# Patient Record
Sex: Female | Born: 1945 | Race: White | Hispanic: No | Marital: Single | Smoking: Current every day smoker
Health system: Southern US, Community
[De-identification: ages and names within clinical notes are randomized; demographics above are authoritative.]

## PROBLEM LIST (undated history)

## (undated) DIAGNOSIS — F419 Anxiety disorder, unspecified: Secondary | ICD-10-CM

## (undated) DIAGNOSIS — F418 Other specified anxiety disorders: Secondary | ICD-10-CM

## (undated) DIAGNOSIS — F32A Depression, unspecified: Secondary | ICD-10-CM

## (undated) DIAGNOSIS — K5792 Diverticulitis of intestine, part unspecified, without perforation or abscess without bleeding: Secondary | ICD-10-CM

## (undated) DIAGNOSIS — J449 Chronic obstructive pulmonary disease, unspecified: Secondary | ICD-10-CM

## (undated) DIAGNOSIS — K449 Diaphragmatic hernia without obstruction or gangrene: Secondary | ICD-10-CM

## (undated) DIAGNOSIS — E039 Hypothyroidism, unspecified: Secondary | ICD-10-CM

## (undated) DIAGNOSIS — G4700 Insomnia, unspecified: Secondary | ICD-10-CM

## (undated) DIAGNOSIS — G5600 Carpal tunnel syndrome, unspecified upper limb: Secondary | ICD-10-CM

## (undated) DIAGNOSIS — C801 Malignant (primary) neoplasm, unspecified: Secondary | ICD-10-CM

## (undated) DIAGNOSIS — T8859XA Other complications of anesthesia, initial encounter: Secondary | ICD-10-CM

## (undated) DIAGNOSIS — E049 Nontoxic goiter, unspecified: Secondary | ICD-10-CM

## (undated) DIAGNOSIS — T4145XA Adverse effect of unspecified anesthetic, initial encounter: Secondary | ICD-10-CM

## (undated) DIAGNOSIS — K219 Gastro-esophageal reflux disease without esophagitis: Secondary | ICD-10-CM

## (undated) DIAGNOSIS — F329 Major depressive disorder, single episode, unspecified: Secondary | ICD-10-CM

## (undated) DIAGNOSIS — R011 Cardiac murmur, unspecified: Secondary | ICD-10-CM

## (undated) DIAGNOSIS — M1990 Unspecified osteoarthritis, unspecified site: Secondary | ICD-10-CM

## (undated) DIAGNOSIS — Z9289 Personal history of other medical treatment: Secondary | ICD-10-CM

## (undated) HISTORY — DX: Anxiety disorder, unspecified: F41.9

## (undated) HISTORY — PX: HX HYSTERECTOMY: SHX81

## (undated) HISTORY — PX: SPINE SURGERY: SHX786

## (undated) HISTORY — DX: Diverticulitis of intestine, part unspecified, without perforation or abscess without bleeding: K57.92

## (undated) HISTORY — DX: Insomnia, unspecified: G47.00

## (undated) HISTORY — PX: COLONOSCOPY: SHX174

## (undated) HISTORY — PX: SHOULDER SURGERY: SHX246

## (undated) HISTORY — DX: Depression, unspecified: F32.A

## (undated) HISTORY — DX: Diaphragmatic hernia without obstruction or gangrene: K44.9

## (undated) HISTORY — DX: Hypothyroidism, unspecified: E03.9

## (undated) HISTORY — PX: HX THYROID BIOPSY: 2101000001

## (undated) HISTORY — DX: Other specified anxiety disorders: F41.8

## (undated) HISTORY — PX: HX WISDOM TEETH EXTRACTION: SHX21

## (undated) HISTORY — PX: COLONOSCOPY W/ POLYPECTOMY: SHX1380

## (undated) HISTORY — DX: Malignant (primary) neoplasm, unspecified: C80.1

## (undated) HISTORY — DX: Unspecified osteoarthritis, unspecified site: M19.90

## (undated) HISTORY — PX: ABDOMINAL HYSTERECTOMY: SHX81

---

## 1989-03-26 ENCOUNTER — Emergency Department (HOSPITAL_COMMUNITY): Payer: Self-pay

## 2014-02-16 ENCOUNTER — Encounter: Payer: Self-pay | Admitting: Pediatrics

## 2014-03-02 ENCOUNTER — Encounter: Payer: Self-pay | Admitting: Pediatrics

## 2014-04-17 ENCOUNTER — Encounter: Payer: Self-pay | Admitting: Pediatrics

## 2014-04-24 ENCOUNTER — Encounter: Payer: Self-pay | Admitting: Pediatrics

## 2014-07-03 ENCOUNTER — Encounter: Payer: Self-pay | Admitting: Pediatrics

## 2014-07-07 ENCOUNTER — Encounter: Payer: Self-pay | Admitting: Pediatrics

## 2014-08-17 ENCOUNTER — Encounter: Payer: Self-pay | Admitting: Pediatrics

## 2014-11-13 ENCOUNTER — Encounter: Payer: Self-pay | Admitting: Pediatrics

## 2015-06-20 ENCOUNTER — Encounter: Payer: Self-pay | Admitting: Pediatrics

## 2015-07-11 ENCOUNTER — Encounter: Payer: Self-pay | Admitting: Pediatrics

## 2015-08-15 DIAGNOSIS — R87622 Low grade squamous intraepithelial lesion on cytologic smear of vagina (LGSIL): Secondary | ICD-10-CM | POA: Diagnosis not present

## 2015-09-12 DIAGNOSIS — D509 Iron deficiency anemia, unspecified: Secondary | ICD-10-CM | POA: Diagnosis not present

## 2015-09-12 DIAGNOSIS — D696 Thrombocytopenia, unspecified: Secondary | ICD-10-CM | POA: Diagnosis not present

## 2015-09-12 DIAGNOSIS — D72829 Elevated white blood cell count, unspecified: Secondary | ICD-10-CM | POA: Diagnosis not present

## 2015-09-24 DIAGNOSIS — D696 Thrombocytopenia, unspecified: Secondary | ICD-10-CM | POA: Diagnosis not present

## 2015-09-24 DIAGNOSIS — D72829 Elevated white blood cell count, unspecified: Secondary | ICD-10-CM | POA: Diagnosis not present

## 2015-10-04 DIAGNOSIS — D696 Thrombocytopenia, unspecified: Secondary | ICD-10-CM | POA: Diagnosis not present

## 2015-10-04 DIAGNOSIS — D72829 Elevated white blood cell count, unspecified: Secondary | ICD-10-CM | POA: Diagnosis not present

## 2015-10-16 DIAGNOSIS — R630 Anorexia: Secondary | ICD-10-CM | POA: Diagnosis not present

## 2015-10-16 DIAGNOSIS — R110 Nausea: Secondary | ICD-10-CM | POA: Diagnosis not present

## 2015-10-16 DIAGNOSIS — D696 Thrombocytopenia, unspecified: Secondary | ICD-10-CM | POA: Diagnosis not present

## 2015-10-30 DIAGNOSIS — J329 Chronic sinusitis, unspecified: Secondary | ICD-10-CM | POA: Diagnosis not present

## 2015-10-30 DIAGNOSIS — J370 Chronic laryngitis: Secondary | ICD-10-CM | POA: Diagnosis not present

## 2015-10-30 DIAGNOSIS — R51 Headache: Secondary | ICD-10-CM | POA: Diagnosis not present

## 2015-10-30 DIAGNOSIS — J342 Deviated nasal septum: Secondary | ICD-10-CM | POA: Diagnosis not present

## 2015-11-02 DIAGNOSIS — D696 Thrombocytopenia, unspecified: Secondary | ICD-10-CM | POA: Diagnosis not present

## 2015-11-02 DIAGNOSIS — R112 Nausea with vomiting, unspecified: Secondary | ICD-10-CM | POA: Diagnosis not present

## 2015-11-02 DIAGNOSIS — J329 Chronic sinusitis, unspecified: Secondary | ICD-10-CM | POA: Diagnosis not present

## 2015-11-02 DIAGNOSIS — R1013 Epigastric pain: Secondary | ICD-10-CM | POA: Diagnosis not present

## 2015-11-02 DIAGNOSIS — R093 Abnormal sputum: Secondary | ICD-10-CM | POA: Diagnosis not present

## 2015-11-02 DIAGNOSIS — R12 Heartburn: Secondary | ICD-10-CM | POA: Diagnosis not present

## 2015-11-08 DIAGNOSIS — J342 Deviated nasal septum: Secondary | ICD-10-CM | POA: Diagnosis not present

## 2015-11-08 DIAGNOSIS — R0982 Postnasal drip: Secondary | ICD-10-CM | POA: Diagnosis not present

## 2015-11-24 ENCOUNTER — Encounter: Payer: Self-pay | Admitting: Pediatrics

## 2015-11-24 DIAGNOSIS — R002 Palpitations: Secondary | ICD-10-CM | POA: Diagnosis not present

## 2015-11-24 DIAGNOSIS — R079 Chest pain, unspecified: Secondary | ICD-10-CM | POA: Diagnosis not present

## 2015-11-24 DIAGNOSIS — R0789 Other chest pain: Secondary | ICD-10-CM | POA: Diagnosis not present

## 2015-11-26 ENCOUNTER — Encounter: Payer: Self-pay | Admitting: Pediatrics

## 2015-11-26 DIAGNOSIS — K209 Esophagitis, unspecified: Secondary | ICD-10-CM | POA: Diagnosis not present

## 2015-11-26 DIAGNOSIS — K2970 Gastritis, unspecified, without bleeding: Secondary | ICD-10-CM | POA: Diagnosis not present

## 2015-11-26 DIAGNOSIS — K210 Gastro-esophageal reflux disease with esophagitis: Secondary | ICD-10-CM | POA: Diagnosis not present

## 2015-11-30 ENCOUNTER — Encounter: Payer: Self-pay | Admitting: Pediatrics

## 2015-11-30 DIAGNOSIS — G4700 Insomnia, unspecified: Secondary | ICD-10-CM | POA: Diagnosis not present

## 2015-11-30 DIAGNOSIS — Z6823 Body mass index (BMI) 23.0-23.9, adult: Secondary | ICD-10-CM | POA: Diagnosis not present

## 2015-11-30 DIAGNOSIS — D696 Thrombocytopenia, unspecified: Secondary | ICD-10-CM | POA: Diagnosis not present

## 2015-11-30 DIAGNOSIS — E785 Hyperlipidemia, unspecified: Secondary | ICD-10-CM | POA: Diagnosis not present

## 2015-11-30 DIAGNOSIS — K2970 Gastritis, unspecified, without bleeding: Secondary | ICD-10-CM | POA: Diagnosis not present

## 2015-11-30 DIAGNOSIS — F411 Generalized anxiety disorder: Secondary | ICD-10-CM | POA: Diagnosis not present

## 2015-11-30 DIAGNOSIS — I498 Other specified cardiac arrhythmias: Secondary | ICD-10-CM | POA: Diagnosis not present

## 2015-11-30 DIAGNOSIS — I4940 Unspecified premature depolarization: Secondary | ICD-10-CM | POA: Diagnosis not present

## 2015-12-06 DIAGNOSIS — Z86010 Personal history of colonic polyps: Secondary | ICD-10-CM | POA: Diagnosis not present

## 2015-12-06 DIAGNOSIS — K635 Polyp of colon: Secondary | ICD-10-CM | POA: Diagnosis not present

## 2015-12-06 DIAGNOSIS — R634 Abnormal weight loss: Secondary | ICD-10-CM | POA: Diagnosis not present

## 2016-02-19 ENCOUNTER — Encounter: Payer: Self-pay | Admitting: Pediatrics

## 2016-02-19 DIAGNOSIS — Z6824 Body mass index (BMI) 24.0-24.9, adult: Secondary | ICD-10-CM | POA: Diagnosis not present

## 2016-02-19 DIAGNOSIS — F329 Major depressive disorder, single episode, unspecified: Secondary | ICD-10-CM | POA: Diagnosis not present

## 2016-02-19 DIAGNOSIS — K219 Gastro-esophageal reflux disease without esophagitis: Secondary | ICD-10-CM | POA: Diagnosis not present

## 2016-02-19 DIAGNOSIS — E785 Hyperlipidemia, unspecified: Secondary | ICD-10-CM | POA: Diagnosis not present

## 2016-02-19 DIAGNOSIS — E039 Hypothyroidism, unspecified: Secondary | ICD-10-CM | POA: Diagnosis not present

## 2016-02-19 DIAGNOSIS — F331 Major depressive disorder, recurrent, moderate: Secondary | ICD-10-CM | POA: Diagnosis not present

## 2016-02-19 DIAGNOSIS — G4700 Insomnia, unspecified: Secondary | ICD-10-CM | POA: Diagnosis not present

## 2016-02-19 DIAGNOSIS — F17200 Nicotine dependence, unspecified, uncomplicated: Secondary | ICD-10-CM | POA: Diagnosis not present

## 2016-05-09 DIAGNOSIS — L03031 Cellulitis of right toe: Secondary | ICD-10-CM | POA: Diagnosis not present

## 2016-05-09 DIAGNOSIS — L03032 Cellulitis of left toe: Secondary | ICD-10-CM | POA: Diagnosis not present

## 2016-05-23 DIAGNOSIS — M65341 Trigger finger, right ring finger: Secondary | ICD-10-CM | POA: Diagnosis not present

## 2016-05-26 ENCOUNTER — Encounter: Payer: Self-pay | Admitting: Pediatrics

## 2016-05-26 DIAGNOSIS — K219 Gastro-esophageal reflux disease without esophagitis: Secondary | ICD-10-CM | POA: Diagnosis not present

## 2016-05-26 DIAGNOSIS — Z6825 Body mass index (BMI) 25.0-25.9, adult: Secondary | ICD-10-CM | POA: Diagnosis not present

## 2016-05-26 DIAGNOSIS — E785 Hyperlipidemia, unspecified: Secondary | ICD-10-CM | POA: Diagnosis not present

## 2016-05-26 DIAGNOSIS — F17200 Nicotine dependence, unspecified, uncomplicated: Secondary | ICD-10-CM | POA: Diagnosis not present

## 2016-05-26 DIAGNOSIS — E784 Other hyperlipidemia: Secondary | ICD-10-CM | POA: Diagnosis not present

## 2016-05-26 DIAGNOSIS — F331 Major depressive disorder, recurrent, moderate: Secondary | ICD-10-CM | POA: Diagnosis not present

## 2016-05-26 DIAGNOSIS — G4700 Insomnia, unspecified: Secondary | ICD-10-CM | POA: Diagnosis not present

## 2016-06-06 DIAGNOSIS — M1711 Unilateral primary osteoarthritis, right knee: Secondary | ICD-10-CM | POA: Diagnosis not present

## 2016-06-06 DIAGNOSIS — M7989 Other specified soft tissue disorders: Secondary | ICD-10-CM | POA: Diagnosis not present

## 2016-06-06 DIAGNOSIS — M25561 Pain in right knee: Secondary | ICD-10-CM | POA: Diagnosis not present

## 2016-08-27 ENCOUNTER — Encounter: Payer: Self-pay | Admitting: Pediatrics

## 2016-08-27 DIAGNOSIS — K2960 Other gastritis without bleeding: Secondary | ICD-10-CM | POA: Diagnosis not present

## 2016-08-27 DIAGNOSIS — J069 Acute upper respiratory infection, unspecified: Secondary | ICD-10-CM | POA: Diagnosis not present

## 2016-08-27 DIAGNOSIS — K2970 Gastritis, unspecified, without bleeding: Secondary | ICD-10-CM | POA: Diagnosis not present

## 2016-08-27 DIAGNOSIS — R079 Chest pain, unspecified: Secondary | ICD-10-CM | POA: Diagnosis not present

## 2016-08-27 DIAGNOSIS — G4700 Insomnia, unspecified: Secondary | ICD-10-CM | POA: Diagnosis not present

## 2016-08-27 DIAGNOSIS — Z6826 Body mass index (BMI) 26.0-26.9, adult: Secondary | ICD-10-CM | POA: Diagnosis not present

## 2016-08-27 DIAGNOSIS — E785 Hyperlipidemia, unspecified: Secondary | ICD-10-CM | POA: Diagnosis not present

## 2016-08-27 DIAGNOSIS — R0789 Other chest pain: Secondary | ICD-10-CM | POA: Diagnosis not present

## 2016-08-27 DIAGNOSIS — K219 Gastro-esophageal reflux disease without esophagitis: Secondary | ICD-10-CM | POA: Diagnosis not present

## 2016-08-27 DIAGNOSIS — E784 Other hyperlipidemia: Secondary | ICD-10-CM | POA: Diagnosis not present

## 2016-09-10 ENCOUNTER — Encounter: Payer: Self-pay | Admitting: Pediatrics

## 2016-09-10 DIAGNOSIS — E875 Hyperkalemia: Secondary | ICD-10-CM | POA: Diagnosis not present

## 2016-11-21 DIAGNOSIS — K219 Gastro-esophageal reflux disease without esophagitis: Secondary | ICD-10-CM | POA: Diagnosis not present

## 2016-11-21 DIAGNOSIS — G4700 Insomnia, unspecified: Secondary | ICD-10-CM | POA: Diagnosis not present

## 2016-11-21 DIAGNOSIS — Z6825 Body mass index (BMI) 25.0-25.9, adult: Secondary | ICD-10-CM | POA: Diagnosis not present

## 2016-11-21 DIAGNOSIS — F411 Generalized anxiety disorder: Secondary | ICD-10-CM | POA: Diagnosis not present

## 2017-03-05 ENCOUNTER — Encounter: Payer: Self-pay | Admitting: Pediatrics

## 2017-03-05 ENCOUNTER — Encounter (INDEPENDENT_AMBULATORY_CARE_PROVIDER_SITE_OTHER): Payer: Self-pay

## 2017-03-05 ENCOUNTER — Ambulatory Visit (INDEPENDENT_AMBULATORY_CARE_PROVIDER_SITE_OTHER): Payer: MEDICARE | Admitting: Pediatrics

## 2017-03-05 ENCOUNTER — Ambulatory Visit (INDEPENDENT_AMBULATORY_CARE_PROVIDER_SITE_OTHER): Payer: MEDICARE

## 2017-03-05 VITALS — BP 115/79 | HR 64 | Temp 97.7°F | Ht 64.0 in | Wt 138.8 lb

## 2017-03-05 DIAGNOSIS — G8929 Other chronic pain: Secondary | ICD-10-CM

## 2017-03-05 DIAGNOSIS — F419 Anxiety disorder, unspecified: Secondary | ICD-10-CM | POA: Diagnosis not present

## 2017-03-05 DIAGNOSIS — Z1159 Encounter for screening for other viral diseases: Secondary | ICD-10-CM

## 2017-03-05 DIAGNOSIS — F329 Major depressive disorder, single episode, unspecified: Secondary | ICD-10-CM | POA: Diagnosis not present

## 2017-03-05 DIAGNOSIS — M545 Low back pain, unspecified: Secondary | ICD-10-CM

## 2017-03-05 DIAGNOSIS — E049 Nontoxic goiter, unspecified: Secondary | ICD-10-CM

## 2017-03-05 DIAGNOSIS — Z13220 Encounter for screening for lipoid disorders: Secondary | ICD-10-CM | POA: Diagnosis not present

## 2017-03-05 DIAGNOSIS — K219 Gastro-esophageal reflux disease without esophagitis: Secondary | ICD-10-CM

## 2017-03-05 DIAGNOSIS — F32A Depression, unspecified: Secondary | ICD-10-CM

## 2017-03-05 DIAGNOSIS — Z1239 Encounter for other screening for malignant neoplasm of breast: Secondary | ICD-10-CM

## 2017-03-05 DIAGNOSIS — Z1231 Encounter for screening mammogram for malignant neoplasm of breast: Secondary | ICD-10-CM | POA: Diagnosis not present

## 2017-03-05 DIAGNOSIS — Z720 Tobacco use: Secondary | ICD-10-CM

## 2017-03-05 DIAGNOSIS — D649 Anemia, unspecified: Secondary | ICD-10-CM | POA: Diagnosis not present

## 2017-03-05 MED ORDER — MIRTAZAPINE 30 MG PO TABS: 30.0000 mg | ORAL_TABLET | Freq: Every day | ORAL | 1 refills | Status: DC

## 2017-03-05 MED ORDER — CITALOPRAM HYDROBROMIDE 20 MG PO TABS: 20.0000 mg | ORAL_TABLET | Freq: Every day | ORAL | 1 refills | Status: DC

## 2017-03-05 MED ORDER — BUSPIRONE HCL 10 MG PO TABS: 10.0000 mg | ORAL_TABLET | Freq: Two times a day (BID) | ORAL | 1 refills | Status: DC

## 2017-03-05 MED ORDER — OMEPRAZOLE 40 MG PO CPDR: 40.0000 mg | DELAYED_RELEASE_CAPSULE | Freq: Every day | ORAL | 1 refills | Status: DC

## 2017-03-05 NOTE — Progress Notes (Signed)
Subjective:   Patient ID: Carrie Villa, female    DOB: Jan 10, 1946, 71 y.o.   MRN: [id number] CC: New Patient (Initial Visit)  HPI: Carrie Villa is a 71 y.o. female presenting for New Patient (Initial Visit)  Feeling well, better she says since meeting boyfriend, here today in clinic H/o depression Has been on remeron for about 1.5 yrs, started after her divorce Takes all three depression/anxiety meds at night, buspar only once a day  H/o hysterectomy at 71yo, 2 years ago had abnormal pap smear Saw gyn, had something cauterized  Mammo is due Has had fibrocystic breast findings requiring fluid No h/o abnormals  Has a heart murmur Has seen a cardiologist for heart fluttering   When she took cholesterol   Saw a hematologist for what sounds like anemia  GERD: takes PPI every day, has symptoms if she misses doses  Has pain in lower back off and on, used to work bending over a lot  Past Medical History:  Diagnosis Date  . Anxiety   . Arthritis   . Cancer [facility])    Cervix   Family History  Problem Relation Age of Onset  . Cancer Mother   mom had cervical ca Maternal aunt with breast ca  Social History   Social History  . Marital status: Single    Spouse name: N/A  . Number of children: N/A  . Years of education: N/A   Social History Main Topics  . Smoking status: Current Every Day Smoker    Packs/day: 0.50    Types: Cigarettes  . Smokeless tobacco: Never Used  . Alcohol use No  . Drug use: No  . Sexual activity: Yes   Other Topics Concern  . None   Social History Narrative  . None   ROS: All systems negative other than what is in HPI  Objective:    BP 115/79   Pulse 64   Temp 97.7 F (36.5 C) (Oral)   Ht 5' 4" (1.626 m)   Wt 138 lb 12.8 oz (63 kg)   BMI 23.82 kg/m   Wt Readings from Last 3 Encounters:  03/05/17 138 lb 12.8 oz (63 kg)    Gen: NAD, alert, cooperative with exam, NCAT EYES: EOMI, no conjunctival injection, or no icterus ENT:   TMs pearly gray b/l, OP without erythema LYMPH: no cervical LAD NECK: small goiter present CV: NRRR, normal S1/S2, soft II/VI systolc ejection murmur, distal pulses 2+ b/l Resp: CTABL, no wheezes, normal WOB Abd: +BS, soft, NTND. no guarding or organomegaly Ext: No edema, warm Neuro: Alert and oriented, strength equal b/l UE and LE, coordination grossly normal MSK: normal muscle bulk Skin: easily mobile, round, soft apprx 1 cm nodules under the skin x2, upper stomach and L side  Assessment & Plan:  [name] was seen today for new patient (initial visit), follow up med problems  Diagnoses and all orders for this visit:  Depression, unspecified depression type Stable on below, appetite has been returning, cont meds -     citalopram (CELEXA) 20 MG tablet; Take 1 tablet (20 mg total) by mouth daily. -     mirtazapine (REMERON) 30 MG tablet; Take 1 tablet (30 mg total) by mouth daily.  Anxiety Some ongoing daytime anxiety, increase to BID buspar -     busPIRone (BUSPAR) 10 MG tablet; Take 1 tablet (10 mg total) by mouth 2 (two) times daily. -     BMP8+EGFR  Anemia, unspecified type Unclear hx, no records though have  been requested, repeat blood work Last colonoscopy 03/2016 -     CBC with Differential/Platelet  Goiter Off of thyroid medicine, has been on in past -     TSH  Need for hepatitis C screening test -     Hepatitis C antibody  Screening for hyperlipidemia -     Lipid panel  Gastroesophageal reflux disease, esophagitis presence not specified Sx stable on below, cont -     omeprazole (PRILOSEC) 40 MG capsule; Take 1 capsule (40 mg total) by mouth daily.  Screening for breast cancer -     MM Digital Screening; Future  Chronic midline low back pain without sciatica -     DG Lumbar Spine 2-3 Views; Future  Tobacco use Encouraged cessation  Follow up plan: Return in about 6 months (around 09/05/2017). Assunta Found, MD [place]

## 2017-03-06 LAB — CBC WITH DIFFERENTIAL/PLATELET
BASOS: 0 %
Basophils Absolute: 0 10*3/uL (ref 0.0–0.2)
EOS (ABSOLUTE): 0 10*3/uL (ref 0.0–0.4)
Eos: 0 %
HEMATOCRIT: 40.8 % (ref 34.0–46.6)
Hemoglobin: 13.3 g/dL (ref 11.1–15.9)
Immature Grans (Abs): 0 10*3/uL (ref 0.0–0.1)
Immature Granulocytes: 0 %
Lymphocytes Absolute: 2.2 10*3/uL (ref 0.7–3.1)
Lymphs: 27 %
MCH: 30.2 pg (ref 26.6–33.0)
MCHC: 32.6 g/dL (ref 31.5–35.7)
MCV: 93 fL (ref 79–97)
MONOS ABS: 0.8 10*3/uL (ref 0.1–0.9)
Monocytes: 9 %
NEUTROS ABS: 5.2 10*3/uL (ref 1.4–7.0)
Neutrophils: 64 %
Platelets: 112 10*3/uL — ABNORMAL LOW (ref 150–379)
RBC: 4.4 x10E6/uL (ref 3.77–5.28)
RDW: 13.3 % (ref 12.3–15.4)
WBC: 8.2 10*3/uL (ref 3.4–10.8)

## 2017-03-06 LAB — BMP8+EGFR
BUN/Creatinine Ratio: 12 (ref 12–28)
BUN: 8 mg/dL (ref 8–27)
CALCIUM: 9.6 mg/dL (ref 8.7–10.3)
CHLORIDE: 105 mmol/L (ref 96–106)
CO2: 25 mmol/L (ref 20–29)
Creatinine, Ser: 0.67 mg/dL (ref 0.57–1.00)
GFR calc non Af Amer: 89 mL/min/{1.73_m2} (ref >59)
GFR, EST AFRICAN AMERICAN: 103 mL/min/{1.73_m2} (ref >59)
Glucose: 99 mg/dL (ref 65–99)
POTASSIUM: 5.6 mmol/L — AB (ref 3.5–5.2)
Sodium: 143 mmol/L (ref 134–144)

## 2017-03-06 LAB — LIPID PANEL
CHOLESTEROL TOTAL: 193 mg/dL (ref 100–199)
Chol/HDL Ratio: 3.2 ratio (ref 0.0–4.4)
HDL: 60 mg/dL (ref >39)
LDL CALC: 108 mg/dL — AB (ref 0–99)
TRIGLYCERIDES: 123 mg/dL (ref 0–149)
VLDL CHOLESTEROL CAL: 25 mg/dL (ref 5–40)

## 2017-03-06 LAB — HEPATITIS C ANTIBODY: Hep C Virus Ab: <0.1 s/co ratio (ref 0.0–0.9)

## 2017-03-06 LAB — TSH: TSH: 0.951 u[IU]/mL (ref 0.450–4.500)

## 2017-03-09 DIAGNOSIS — H2513 Age-related nuclear cataract, bilateral: Secondary | ICD-10-CM | POA: Diagnosis not present

## 2017-03-09 DIAGNOSIS — H524 Presbyopia: Secondary | ICD-10-CM | POA: Diagnosis not present

## 2017-03-12 ENCOUNTER — Telehealth: Payer: Self-pay | Admitting: Pediatrics

## 2017-03-12 DIAGNOSIS — E785 Hyperlipidemia, unspecified: Secondary | ICD-10-CM

## 2017-03-12 DIAGNOSIS — F329 Major depressive disorder, single episode, unspecified: Secondary | ICD-10-CM

## 2017-03-12 DIAGNOSIS — K219 Gastro-esophageal reflux disease without esophagitis: Secondary | ICD-10-CM

## 2017-03-12 DIAGNOSIS — F32A Depression, unspecified: Secondary | ICD-10-CM

## 2017-03-12 DIAGNOSIS — F419 Anxiety disorder, unspecified: Secondary | ICD-10-CM

## 2017-03-12 MED ORDER — PRAVASTATIN SODIUM 20 MG PO TABS: 20.0000 mg | ORAL_TABLET | Freq: Every day | ORAL | 3 refills | Status: DC

## 2017-03-12 MED ORDER — BUSPIRONE HCL 10 MG PO TABS: 10.0000 mg | ORAL_TABLET | Freq: Two times a day (BID) | ORAL | 1 refills | Status: DC

## 2017-03-12 MED ORDER — MIRTAZAPINE 30 MG PO TABS: 30.0000 mg | ORAL_TABLET | Freq: Every day | ORAL | 1 refills | Status: DC

## 2017-03-12 MED ORDER — CITALOPRAM HYDROBROMIDE 20 MG PO TABS: 20.0000 mg | ORAL_TABLET | Freq: Every day | ORAL | 1 refills | Status: DC

## 2017-03-12 MED ORDER — OMEPRAZOLE 40 MG PO CPDR: 40.0000 mg | DELAYED_RELEASE_CAPSULE | Freq: Every day | ORAL | 1 refills | Status: DC

## 2017-03-12 NOTE — Telephone Encounter (Signed)
ASCVD risk 12.2% over 50yr mostly due to smoking hx Discussed options, pt agreeable to starting statin, sent in.

## 2017-03-12 NOTE — Telephone Encounter (Signed)
Medication sent to [facility] in [facility]

## 2017-03-18 DIAGNOSIS — Z1231 Encounter for screening mammogram for malignant neoplasm of breast: Secondary | ICD-10-CM | POA: Diagnosis not present

## 2017-03-23 ENCOUNTER — Ambulatory Visit (HOSPITAL_COMMUNITY): Payer: Self-pay

## 2017-04-09 ENCOUNTER — Telehealth: Payer: Self-pay | Admitting: Pediatrics

## 2017-04-09 MED ORDER — INDOMETHACIN 25 MG PO CAPS: 25.0000 mg | ORAL_CAPSULE | Freq: Three times a day (TID) | ORAL | 0 refills | Status: DC | PRN

## 2017-04-09 NOTE — Telephone Encounter (Signed)
Pt notified of RX 

## 2017-04-09 NOTE — Telephone Encounter (Signed)
Patient is requesting a prescription for a antiinflammatory to help with her back pain .

## 2017-04-09 NOTE — Telephone Encounter (Signed)
What symptoms do you have? Back pain  How long have you been sick? For a month  Have you been seen for this problem? yes  If your provider decides to give you a prescription, which pharmacy would you like for it to be sent to[facility] [place]   Patient informed that this information will be sent to the clinical staff for review and that they should receive a follow up call.

## 2017-04-22 DIAGNOSIS — R928 Other abnormal and inconclusive findings on diagnostic imaging of breast: Secondary | ICD-10-CM | POA: Diagnosis not present

## 2017-04-22 DIAGNOSIS — R922 Inconclusive mammogram: Secondary | ICD-10-CM | POA: Diagnosis not present

## 2017-05-03 DIAGNOSIS — Z23 Encounter for immunization: Secondary | ICD-10-CM | POA: Diagnosis not present

## 2017-09-05 ENCOUNTER — Other Ambulatory Visit: Payer: Self-pay | Admitting: Pediatrics

## 2017-09-05 DIAGNOSIS — F32A Depression, unspecified: Secondary | ICD-10-CM

## 2017-09-05 DIAGNOSIS — F419 Anxiety disorder, unspecified: Secondary | ICD-10-CM

## 2017-09-05 DIAGNOSIS — K219 Gastro-esophageal reflux disease without esophagitis: Secondary | ICD-10-CM

## 2017-09-05 DIAGNOSIS — F329 Major depressive disorder, single episode, unspecified: Secondary | ICD-10-CM

## 2017-09-07 ENCOUNTER — Ambulatory Visit (INDEPENDENT_AMBULATORY_CARE_PROVIDER_SITE_OTHER): Payer: MEDICARE | Admitting: Pediatrics

## 2017-09-07 ENCOUNTER — Encounter: Payer: Self-pay | Admitting: Pediatrics

## 2017-09-07 VITALS — BP 119/69 | HR 70 | Temp 97.1°F | Ht 64.0 in | Wt 140.4 lb

## 2017-09-07 DIAGNOSIS — F32A Depression, unspecified: Secondary | ICD-10-CM

## 2017-09-07 DIAGNOSIS — F329 Major depressive disorder, single episode, unspecified: Secondary | ICD-10-CM | POA: Diagnosis not present

## 2017-09-07 DIAGNOSIS — M542 Cervicalgia: Secondary | ICD-10-CM

## 2017-09-07 DIAGNOSIS — E785 Hyperlipidemia, unspecified: Secondary | ICD-10-CM | POA: Diagnosis not present

## 2017-09-07 MED ORDER — PREDNISONE 10 MG (21) PO TBPK: ORAL_TABLET | Freq: Every day | ORAL | 0 refills | Status: DC

## 2017-09-07 NOTE — Progress Notes (Signed)
  Subjective:   Patient ID: Carrie Villa, female    DOB: [birth year], 72 y.o.   MRN: [id number] CC: Follow-up (6 month) and Neck Pain (3 month)  HPI: Carrie Villa is a 72 y.o. female presenting for Follow-up (6 month) and Neck Pain (3 month)  Burning sensation from L side of neck into L arm starting about 3 mo ago Had shoulder surgery 8 yrs ago, couldn't raise arm up when surgery done, says they went in and "scraped" the shoulder, ROM improved after that Doesn't remember any injury to her neck at the start of the pain Doesn't have weakness in her L hand or arm  Has tingling/numb feeling that started same time as the neck in L dorsum of foot and lower leg Also new No injury No tripping, no falls Continues to have low back pain off and on  Depression: symptoms controlled on current medicines  HLD: doing fine on cholesterol medicine  Relevant past medical, surgical, family and social history reviewed. Allergies and medications reviewed and updated. Social History   Tobacco Use  Smoking Status Current Every Day Smoker  . Packs/day: 0.50  . Types: Cigarettes  Smokeless Tobacco Never Used   ROS: Per HPI   Objective:    BP 119/69   Pulse 70   Temp (!) 97.1 F (36.2 C) (Oral)   Ht 5\' 4"  (1.626 m)   Wt 140 lb 6.4 oz (63.7 kg)   BMI 24.10 kg/m   Wt Readings from Last 3 Encounters:  09/07/17 140 lb 6.4 oz (63.7 kg)  03/05/17 138 lb 12.8 oz (63 kg)    Gen: NAD, alert, cooperative with exam, NCAT EYES: EOMI, no conjunctival injection, or no icterus ENT: OP without erythema LYMPH: no cervical LAD CV: NRRR, normal S1/S2, no murmur, distal pulses 2+ b/l Resp: CTABL, no wheezes, normal WOB Ext: No edema, warm Neuro: Alert and oriented, coordination grossly normal, decreased sensation over dorsum of L foot compared to R foot MSK: no ttp over cervical spine Normal ROM neck, pain in L side of neck with tilting head to R shoulder ttp top of L shoulder into L side of neck Hand  grip 5/5 b/l  Assessment & Plan:  Carrie Villa was seen today for follow-up and neck pain.  Diagnoses and all orders for this visit:  Neck pain -     Ambulatory referral to Orthopedic Surgery -     predniSONE (STERAPRED UNI-PAK 21 TAB) 10 MG (21) TBPK tablet; Take by mouth daily. As directed x 6 days  Depression, unspecified depression type Stable, cont current meds  Hyperlipidemia, unspecified hyperlipidemia type Stable, cont current meds  Follow up plan: Return in about 6 months (around 03/07/2018). Assunta Found, MD [place]

## 2017-09-08 ENCOUNTER — Telehealth: Payer: Self-pay | Admitting: Pediatrics

## 2017-09-08 ENCOUNTER — Other Ambulatory Visit: Payer: Self-pay | Admitting: *Deleted

## 2017-09-08 NOTE — Telephone Encounter (Signed)
Aware.  Refill request being addressed.

## 2017-09-08 NOTE — Telephone Encounter (Signed)
What is the name of the medication? Antidepressant rx-she is not sure which on it is Pt was here yesterday to see VINCENT  Have you contacted your pharmacy to request a refill? no  [facility]   Patient notified that their request is being sent to the clinical staff for review and that they should receive a call once it is complete. If they do not receive a call within 24 hours they can check with their pharmacy or our office.

## 2017-09-24 ENCOUNTER — Other Ambulatory Visit (INDEPENDENT_AMBULATORY_CARE_PROVIDER_SITE_OTHER): Payer: MEDICARE

## 2017-09-24 ENCOUNTER — Other Ambulatory Visit: Payer: Self-pay | Admitting: Orthopedic Surgery

## 2017-09-24 DIAGNOSIS — M47812 Spondylosis without myelopathy or radiculopathy, cervical region: Secondary | ICD-10-CM | POA: Insufficient documentation

## 2017-09-24 DIAGNOSIS — R52 Pain, unspecified: Secondary | ICD-10-CM | POA: Diagnosis not present

## 2017-09-29 ENCOUNTER — Encounter: Payer: Self-pay | Admitting: Family Medicine

## 2017-09-29 DIAGNOSIS — M47812 Spondylosis without myelopathy or radiculopathy, cervical region: Secondary | ICD-10-CM | POA: Diagnosis not present

## 2017-10-01 DIAGNOSIS — M47812 Spondylosis without myelopathy or radiculopathy, cervical region: Secondary | ICD-10-CM | POA: Diagnosis not present

## 2017-10-15 DIAGNOSIS — M47812 Spondylosis without myelopathy or radiculopathy, cervical region: Secondary | ICD-10-CM | POA: Diagnosis not present

## 2017-12-04 ENCOUNTER — Other Ambulatory Visit: Payer: Self-pay | Admitting: Pediatrics

## 2017-12-04 DIAGNOSIS — F32A Depression, unspecified: Secondary | ICD-10-CM

## 2017-12-04 DIAGNOSIS — F329 Major depressive disorder, single episode, unspecified: Secondary | ICD-10-CM

## 2017-12-04 DIAGNOSIS — F419 Anxiety disorder, unspecified: Secondary | ICD-10-CM

## 2017-12-24 DIAGNOSIS — M47812 Spondylosis without myelopathy or radiculopathy, cervical region: Secondary | ICD-10-CM | POA: Diagnosis not present

## 2018-03-01 ENCOUNTER — Other Ambulatory Visit: Payer: Self-pay | Admitting: Pediatrics

## 2018-03-01 DIAGNOSIS — E785 Hyperlipidemia, unspecified: Secondary | ICD-10-CM

## 2018-03-02 ENCOUNTER — Other Ambulatory Visit: Payer: Self-pay | Admitting: Pediatrics

## 2018-03-02 DIAGNOSIS — F419 Anxiety disorder, unspecified: Secondary | ICD-10-CM

## 2018-03-02 DIAGNOSIS — F32A Depression, unspecified: Secondary | ICD-10-CM

## 2018-03-02 DIAGNOSIS — F329 Major depressive disorder, single episode, unspecified: Secondary | ICD-10-CM

## 2018-03-02 NOTE — Telephone Encounter (Signed)
OV 03/08/18

## 2018-03-03 NOTE — Telephone Encounter (Signed)
OV 03/08/18

## 2018-03-08 ENCOUNTER — Ambulatory Visit (INDEPENDENT_AMBULATORY_CARE_PROVIDER_SITE_OTHER): Payer: MEDICARE | Admitting: Pediatrics

## 2018-03-08 ENCOUNTER — Encounter: Payer: Self-pay | Admitting: Pediatrics

## 2018-03-08 VITALS — BP 127/80 | HR 65 | Temp 96.1°F | Ht 64.0 in | Wt 136.4 lb

## 2018-03-08 DIAGNOSIS — F419 Anxiety disorder, unspecified: Secondary | ICD-10-CM | POA: Diagnosis not present

## 2018-03-08 DIAGNOSIS — R634 Abnormal weight loss: Secondary | ICD-10-CM | POA: Diagnosis not present

## 2018-03-08 DIAGNOSIS — E785 Hyperlipidemia, unspecified: Secondary | ICD-10-CM | POA: Diagnosis not present

## 2018-03-08 DIAGNOSIS — F339 Major depressive disorder, recurrent, unspecified: Secondary | ICD-10-CM

## 2018-03-08 MED ORDER — MIRTAZAPINE 30 MG PO TABS: 30.0000 mg | ORAL_TABLET | Freq: Every day | ORAL | 1 refills | Status: DC

## 2018-03-08 MED ORDER — BUSPIRONE HCL 10 MG PO TABS: 10.0000 mg | ORAL_TABLET | Freq: Two times a day (BID) | ORAL | 1 refills | Status: DC

## 2018-03-08 MED ORDER — CITALOPRAM HYDROBROMIDE 20 MG PO TABS: 20.0000 mg | ORAL_TABLET | Freq: Every day | ORAL | 1 refills | Status: DC

## 2018-03-08 NOTE — Progress Notes (Signed)
  Subjective:   Patient ID: Carrie Villa, female    DOB: 12-05-1945, 72 y.o.   MRN: [id number] CC: Medical Management of Chronic Issues  HPI: Carrie Villa is a 72 y.o. female   Weight loss: She is been trying to gain weight.  Increasing small meals in a day.  She would like to be a size 10, now she is a size 8.  She stays very active during the day.  She does not sit down very much.  Depression: Mood is been doing well.  No change recently in her medicines.  She is been sleeping okay at night.  Anxiety: Symptoms well controlled, taking her medicine regularly.    Hyperlipidemia: Started on pravastatin in the recent past, could not continue it because of pain in her feet.  Relevant past medical, surgical, family and social history reviewed. Allergies and medications reviewed and updated. Social History   Tobacco Use  Smoking Status Current Every Day Smoker  . Packs/day: 0.50  . Types: Cigarettes  Smokeless Tobacco Never Used   ROS: Per HPI   Objective:    BP 127/80   Pulse 65   Temp (!) 96.1 F (35.6 C) (Oral)   Ht _0  (1.626 m)   Wt 136 lb 6.4 oz (61.9 kg)   BMI 23.41 kg/m   Wt Readings from Last 3 Encounters:  03/08/18 136 lb 6.4 oz (61.9 kg)  09/07/17 140 lb 6.4 oz (63.7 kg)  03/05/17 138 lb 12.8 oz (63 kg)    Gen: NAD, alert, cooperative with exam, NCAT EYES: EOMI, no conjunctival injection, or no icterus CV: NRRR, normal S1/S2, no murmur, distal pulses 2+ b/l Resp: CTABL, no wheezes, normal WOB Abd: +BS, soft, mildly tender with deep palpation, ND. no guarding or organomegaly Ext: No edema, warm Neuro: Alert and oriented, strength equal b/l UE and LE, coordination grossly normal MSK: normal muscle bulk  Assessment & Plan:  Carrie Villa was seen today for medical management of chronic issues.  Diagnoses and all orders for this visit:  Depression, recurrent (Carrie Villa)  Stable, continue below-     citalopram (CELEXA) 20 MG tablet; Take 1 tablet (20 mg total) by mouth  daily. -     mirtazapine (REMERON) 30 MG tablet; Take 1 tablet (30 mg total) by mouth daily.  Anxiety Stable, continue below -     busPIRone (BUSPAR) 10 MG tablet; Take 1 tablet (10 mg total) by mouth 2 (two) times daily.  Hyperlipidemia, unspecified hyperlipidemia type Continue regular physical activity, eating lots of fruits and vegetables. -     Lipid panel  Weight loss Slight amount of weight loss.  Will check below. -     CMP14+EGFR -     TSH -     CBC with Differential   Follow up plan: Return in about 3 months (around 06/08/2018). Carrie Found, MD [place]

## 2018-03-09 LAB — CBC WITH DIFFERENTIAL/PLATELET
BASOS ABS: 0 10*3/uL (ref 0.0–0.2)
BASOS: 0 %
EOS (ABSOLUTE): 0.1 10*3/uL (ref 0.0–0.4)
Eos: 1 %
Hematocrit: 43.9 % (ref 34.0–46.6)
Hemoglobin: 14.2 g/dL (ref 11.1–15.9)
IMMATURE GRANS (ABS): 0 10*3/uL (ref 0.0–0.1)
Immature Granulocytes: 0 %
LYMPHS: 26 %
Lymphocytes Absolute: 2.5 10*3/uL (ref 0.7–3.1)
MCH: 30.6 pg (ref 26.6–33.0)
MCHC: 32.3 g/dL (ref 31.5–35.7)
MCV: 95 fL (ref 79–97)
MONOS ABS: 0.7 10*3/uL (ref 0.1–0.9)
Monocytes: 7 %
NEUTROS ABS: 6.4 10*3/uL (ref 1.4–7.0)
NEUTROS PCT: 66 %
PLATELETS: 127 10*3/uL — AB (ref 150–450)
RBC: 4.64 x10E6/uL (ref 3.77–5.28)
RDW: 13.5 % (ref 12.3–15.4)
WBC: 9.7 10*3/uL (ref 3.4–10.8)

## 2018-03-09 LAB — CMP14+EGFR
ALK PHOS: 73 IU/L (ref 39–117)
ALT: 14 IU/L (ref 0–32)
AST: 20 IU/L (ref 0–40)
Albumin/Globulin Ratio: 2.1 (ref 1.2–2.2)
Albumin: 4.4 g/dL (ref 3.5–4.8)
BILIRUBIN TOTAL: 0.3 mg/dL (ref 0.0–1.2)
BUN/Creatinine Ratio: 13 (ref 12–28)
BUN: 9 mg/dL (ref 8–27)
CHLORIDE: 104 mmol/L (ref 96–106)
CO2: 26 mmol/L (ref 20–29)
CREATININE: 0.67 mg/dL (ref 0.57–1.00)
Calcium: 9.5 mg/dL (ref 8.7–10.3)
GFR calc Af Amer: 102 mL/min/{1.73_m2} (ref >59)
GFR calc non Af Amer: 89 mL/min/{1.73_m2} (ref >59)
GLUCOSE: 88 mg/dL (ref 65–99)
Globulin, Total: 2.1 g/dL (ref 1.5–4.5)
Potassium: 4.7 mmol/L (ref 3.5–5.2)
SODIUM: 144 mmol/L (ref 134–144)
Total Protein: 6.5 g/dL (ref 6.0–8.5)

## 2018-03-09 LAB — LIPID PANEL
CHOLESTEROL TOTAL: 222 mg/dL — AB (ref 100–199)
Chol/HDL Ratio: 3.3 ratio (ref 0.0–4.4)
HDL: 67 mg/dL (ref >39)
LDL Calculated: 135 mg/dL — ABNORMAL HIGH (ref 0–99)
Triglycerides: 100 mg/dL (ref 0–149)
VLDL CHOLESTEROL CAL: 20 mg/dL (ref 5–40)

## 2018-03-09 LAB — TSH: TSH: 1.39 u[IU]/mL (ref 0.450–4.500)

## 2018-03-24 ENCOUNTER — Telehealth: Payer: Self-pay | Admitting: Pediatrics

## 2018-03-24 NOTE — Telephone Encounter (Signed)
Reviewed, pt picked up her pravastatin, is going to try taking it every other day

## 2018-03-24 NOTE — Telephone Encounter (Signed)
Will you review labs from last visit. Doesn't appear to be signed off on with a note.

## 2018-04-04 ENCOUNTER — Other Ambulatory Visit: Payer: Self-pay | Admitting: Pediatrics

## 2018-04-04 DIAGNOSIS — E785 Hyperlipidemia, unspecified: Secondary | ICD-10-CM

## 2018-05-21 DIAGNOSIS — Z23 Encounter for immunization: Secondary | ICD-10-CM | POA: Diagnosis not present

## 2018-06-09 ENCOUNTER — Ambulatory Visit (INDEPENDENT_AMBULATORY_CARE_PROVIDER_SITE_OTHER): Payer: MEDICARE | Admitting: Pediatrics

## 2018-06-09 ENCOUNTER — Encounter: Payer: Self-pay | Admitting: Pediatrics

## 2018-06-09 ENCOUNTER — Ambulatory Visit (INDEPENDENT_AMBULATORY_CARE_PROVIDER_SITE_OTHER): Payer: MEDICARE

## 2018-06-09 VITALS — BP 129/81 | HR 79 | Temp 96.9°F | Ht 64.0 in | Wt 147.2 lb

## 2018-06-09 DIAGNOSIS — M25552 Pain in left hip: Secondary | ICD-10-CM | POA: Diagnosis not present

## 2018-06-09 DIAGNOSIS — K219 Gastro-esophageal reflux disease without esophagitis: Secondary | ICD-10-CM

## 2018-06-09 DIAGNOSIS — M7021 Olecranon bursitis, right elbow: Secondary | ICD-10-CM

## 2018-06-09 DIAGNOSIS — F419 Anxiety disorder, unspecified: Secondary | ICD-10-CM

## 2018-06-09 DIAGNOSIS — F339 Major depressive disorder, recurrent, unspecified: Secondary | ICD-10-CM | POA: Diagnosis not present

## 2018-06-09 DIAGNOSIS — M5432 Sciatica, left side: Secondary | ICD-10-CM | POA: Diagnosis not present

## 2018-06-09 DIAGNOSIS — S59901A Unspecified injury of right elbow, initial encounter: Secondary | ICD-10-CM | POA: Diagnosis not present

## 2018-06-09 MED ORDER — PANTOPRAZOLE SODIUM 40 MG PO TBEC: 40.0000 mg | DELAYED_RELEASE_TABLET | Freq: Every day | ORAL | 3 refills | Status: DC

## 2018-06-09 NOTE — Progress Notes (Signed)
  Subjective:   Patient ID: Carrie Villa, female    DOB: 1946/06/05, 72 y.o.   MRN: [id number] CC: Medical Management of Chronic Issues and Elbow Pain  HPI: Carrie Villa is a 72 y.o. female   Depression: Mood is been stable on citalopram and Remeron.  Sleeping well.  Left hip has been bothering her regularly, walking for periods of time, getting up to stand.  Also with pain left lower back.  Regularly has pain going down left leg.  Right elbow has had swelling over the bone that gets larger and smaller over the last few weeks.  No injury that she knows of.  Anxiety: Taking BuSpar twice daily.  Symptoms fairly well controlled.  Relevant past medical, surgical, family and social history reviewed. Allergies and medications reviewed and updated. Social History   Tobacco Use  Smoking Status Current Every Day Smoker  . Packs/day: 0.50  . Types: Cigarettes  Smokeless Tobacco Never Used   ROS: Per HPI   Objective:    BP 129/81   Pulse 79   Temp (!) 96.9 F (36.1 C) (Oral)   Ht 5\' 4"  (1.626 m)   Wt 147 lb 3.2 oz (66.8 kg)   BMI 25.27 kg/m   Wt Readings from Last 3 Encounters:  06/09/18 147 lb 3.2 oz (66.8 kg)  03/08/18 136 lb 6.4 oz (61.9 kg)  09/07/17 140 lb 6.4 oz (63.7 kg)    Gen: NAD, alert, cooperative with exam, NCAT EYES: EOMI, no conjunctival injection, or no icterus ENT: OP without erythema LYMPH: no cervical LAD CV: NRRR, normal S1/S2, no murmur, distal pulses 2+ b/l Resp: CTABL, no wheezes, normal WOB Abd: +BS, soft, NTND. no guarding or organomegaly Ext: No edema, warm Neuro: Alert and oriented MSK: Some pain with external rotation of left hip.  Normal rotation right hip. Small, quarter sized, isolated fluid collection over olecranon, consistent with olecranon bursitis.  Assessment & Plan:  Carrie Villa was seen today for medical management of chronic issues and elbow pain.  Diagnoses and all orders for this visit:  Left hip pain We will check x-ray -     DG  HIP UNILAT W OR W/O PELVIS 2-3 VIEWS LEFT; Future  Olecranon bursitis of right elbow Avoid reinjuring. -     DG Elbow 2 Views Right; Future  Sciatica of left side -     Ambulatory referral to Orthopedic Surgery  Gastroesophageal reflux disease, esophagitis presence not specified Stable, continue below -     pantoprazole (PROTONIX) 40 MG tablet; Take 1 tablet (40 mg total) by mouth daily.  Anxiety Stable, continue current medicines  Recurrent major depressive disorder, remission status unspecified ([place]) Stable, continue current medicines  Follow up plan: Return in about 6 months (around 12/08/2018). Assunta Found, MD [place]

## 2018-07-13 ENCOUNTER — Telehealth: Payer: Self-pay | Admitting: Pediatrics

## 2018-07-13 ENCOUNTER — Ambulatory Visit: Payer: MEDICARE | Admitting: Family Medicine

## 2018-07-13 NOTE — Telephone Encounter (Signed)
Pt advised she would ntbs and scheduled with Dr Dettinger this afternoon at 2:25.

## 2018-07-20 ENCOUNTER — Ambulatory Visit (INDEPENDENT_AMBULATORY_CARE_PROVIDER_SITE_OTHER): Payer: MEDICARE | Admitting: Family Medicine

## 2018-07-20 ENCOUNTER — Encounter: Payer: Self-pay | Admitting: Family Medicine

## 2018-07-20 ENCOUNTER — Encounter: Payer: Self-pay | Admitting: Pediatrics

## 2018-07-20 VITALS — BP 119/69 | HR 66 | Temp 97.0°F | Ht 64.0 in | Wt 149.0 lb

## 2018-07-20 DIAGNOSIS — J069 Acute upper respiratory infection, unspecified: Secondary | ICD-10-CM | POA: Diagnosis not present

## 2018-07-20 DIAGNOSIS — B9789 Other viral agents as the cause of diseases classified elsewhere: Secondary | ICD-10-CM | POA: Diagnosis not present

## 2018-07-20 MED ORDER — CEFDINIR 300 MG PO CAPS: 300.0000 mg | ORAL_CAPSULE | Freq: Two times a day (BID) | ORAL | 0 refills | Status: DC

## 2018-07-20 MED ORDER — GUAIFENESIN-CODEINE 100-10 MG/5ML PO SOLN: 5.0000 mL | Freq: Four times a day (QID) | ORAL | 0 refills | Status: DC | PRN

## 2018-07-20 NOTE — Progress Notes (Signed)
Subjective: CC: Cough PCP: Eustaquio Maize, MD ZOX:WRUEA Rogoff is a 72 y.o. female presenting to clinic today for:  1. Cough Patient reports productive cough that has been ongoing for about 2 weeks.  She notes that sometimes she coughs so hard she has rib pain.  Denies any hemoptysis.  She reports occasional wheeze but no shortness of breath, fevers or chills.  No nausea, vomiting or diarrhea.  No rhinorrhea or nasal congestion.  She has been using Mucinex with no improvement in symptoms.   ROS: Per HPI  Allergies  Allergen Reactions  . Pravastatin     Foot pain at 20mg    Past Medical History:  Diagnosis Date  . Anxiety   . Arthritis   . Cancer [facility])    Cervix    Current Outpatient Medications:  .  busPIRone (BUSPAR) 10 MG tablet, Take 1 tablet (10 mg total) by mouth 2 (two) times daily., Disp: 180 tablet, Rfl: 1 .  citalopram (CELEXA) 20 MG tablet, Take 1 tablet (20 mg total) by mouth daily., Disp: 90 tablet, Rfl: 1 .  mirtazapine (REMERON) 30 MG tablet, Take 1 tablet (30 mg total) by mouth daily., Disp: 90 tablet, Rfl: 1 .  pantoprazole (PROTONIX) 40 MG tablet, Take 1 tablet (40 mg total) by mouth daily., Disp: 30 tablet, Rfl: 3 Social History   Socioeconomic History  . Marital status: Single    Spouse name: Not on file  . Number of children: Not on file  . Years of education: Not on file  . Highest education level: Not on file  Occupational History  . Not on file  Social Needs  . Financial resource strain: Not on file  . Food insecurity:    Worry: Not on file    Inability: Not on file  . Transportation needs:    Medical: Not on file    Non-medical: Not on file  Tobacco Use  . Smoking status: Current Every Day Smoker    Packs/day: 0.50    Types: Cigarettes  . Smokeless tobacco: Never Used  Substance and Sexual Activity  . Alcohol use: No  . Drug use: No  . Sexual activity: Yes  Lifestyle  . Physical activity:    Days per week: Not on file   Minutes per session: Not on file  . Stress: Not on file  Relationships  . Social connections:    Talks on phone: Not on file    Gets together: Not on file    Attends religious service: Not on file    Active member of club or organization: Not on file    Attends meetings of clubs or organizations: Not on file    Relationship status: Not on file  . Intimate partner violence:    Fear of current or ex partner: Not on file    Emotionally abused: Not on file    Physically abused: Not on file    Forced sexual activity: Not on file  Other Topics Concern  . Not on file  Social History Narrative  . Not on file   Family History  Problem Relation Age of Onset  . Cancer Mother     Objective: Office vital signs reviewed. BP 119/69   Pulse 66   Temp (!) 97 F (36.1 C) (Oral)   Ht 5\' 4"  (1.626 m)   Wt 149 lb (67.6 kg)   SpO2 96%   BMI 25.58 kg/m   Physical Examination:  General: Awake, alert, well nourished, No acute distress  HEENT: Normal    Neck: No masses palpated. No lymphadenopathy    Ears: Tympanic membranes intact, normal light reflex, no erythema, no bulging    Eyes: PERRLA, extraocular membranes intact, sclera white    Nose: nasal turbinates moist, no nasal discharge    Throat: moist mucus membranes, no erythema, no tonsillar exudate.  Airway is patent Cardio: regular rate and rhythm, S1S2 heard, no murmurs appreciated Pulm: clear to auscultation bilaterally, no wheezes, rhonchi or rales; normal work of breathing on room air  Assessment/ Plan: 72 y.o. female   1. Viral URI with cough Patient is afebrile nontoxic-appearing with normal vital signs.  She has normal work of breathing and normal oxygen saturation on room air.  Physical exam is unremarkable.  Robitussin-AC prescribed for cough suppressant.  We discussed the sedative nature of the medication and she is not to operate any heavy machinery or drive while taking the medicine.  I have given her a pocket prescription  for Omnicef to use if symptoms not improving given the upcoming holiday.  We reviewed indications for use at length.  Handout provided.  Return cautions discussed.  Follow-up PRN.   No orders of the defined types were placed in this encounter.  Meds ordered this encounter  Medications  . cefdinir (OMNICEF) 300 MG capsule    Sig: Take 1 capsule (300 mg total) by mouth 2 (two) times daily. 1 po BID    Dispense:  20 capsule    Refill:  0  . guaiFENesin-codeine 100-10 MG/5ML syrup    Sig: Take 5 mLs by mouth every 6 (six) hours as needed for cough.    Dispense:  100 mL    Refill:  0   The Narcotic Database has been reviewed.  There were no red flags.      Janora Norlander, DO [place] [phone]

## 2018-07-20 NOTE — Patient Instructions (Signed)
It appears that you have a viral upper respiratory infection (cold).  Cold symptoms can last up to 2 weeks.   I have sent in a cough medication containing codeine.  We discussed this can cause sedation and you should not use it if you need to drive or concentrate. I have also given you a written prescription for an antibiotic that you may use if you develop any other symptoms or signs we discussed or if symptoms are not improving at all in the next 3 days.  - Get plenty of rest and drink plenty of fluids. - Try to breathe moist air. Use a cold mist humidifier. - Consume warm fluids (soup or tea) to provide relief for a stuffy nose and to loosen phlegm. - For nasal stuffiness, try saline nasal spray or a Neti Pot. Afrin nasal spray can also be used but this product should not be used longer than 3 days or it will cause rebound nasal stuffiness (worsening nasal congestion). - For sore throat pain relief: use chloraseptic spray, suck on throat lozenges, hard candy or popsicles; gargle with warm salt water (1/4 tsp. salt per 8 oz. of water); and eat soft, bland foods. - Eat a well-balanced diet. If you cannot, ensure you are getting enough nutrients by taking a daily multivitamin. - Avoid dairy products, as they can thicken phlegm. - Avoid alcohol, as it impairs your body's immune system.  CONTACT YOUR DOCTOR IF YOU EXPERIENCE ANY OF THE FOLLOWING: - High fever - Ear pain - Sinus-type headache - Unusually severe cold symptoms - Cough that gets worse while other cold symptoms improve - Flare up of any chronic lung problem, such as asthma - Your symptoms persist longer than 2 weeks

## 2018-08-10 ENCOUNTER — Telehealth: Payer: Self-pay | Admitting: Pediatrics

## 2018-08-10 NOTE — Telephone Encounter (Signed)
Patient aware referral has been placed to [facility]

## 2018-08-17 ENCOUNTER — Encounter: Payer: Self-pay | Admitting: Pediatrics

## 2018-08-17 ENCOUNTER — Encounter (INDEPENDENT_AMBULATORY_CARE_PROVIDER_SITE_OTHER): Payer: MEDICARE | Admitting: Pediatrics

## 2018-08-17 ENCOUNTER — Other Ambulatory Visit: Payer: Self-pay | Admitting: *Deleted

## 2018-08-17 DIAGNOSIS — G8929 Other chronic pain: Secondary | ICD-10-CM

## 2018-08-17 DIAGNOSIS — M5432 Sciatica, left side: Secondary | ICD-10-CM

## 2018-08-17 DIAGNOSIS — M25552 Pain in left hip: Secondary | ICD-10-CM

## 2018-08-17 DIAGNOSIS — M545 Low back pain: Secondary | ICD-10-CM

## 2018-08-17 NOTE — Progress Notes (Signed)
Record request sent to [facility]

## 2018-08-26 ENCOUNTER — Ambulatory Visit (INDEPENDENT_AMBULATORY_CARE_PROVIDER_SITE_OTHER): Payer: MEDICARE | Admitting: Orthopaedic Surgery

## 2018-08-26 ENCOUNTER — Encounter (INDEPENDENT_AMBULATORY_CARE_PROVIDER_SITE_OTHER): Payer: Self-pay | Admitting: Orthopaedic Surgery

## 2018-08-26 VITALS — BP 139/85 | HR 65 | Ht 64.0 in | Wt 149.0 lb

## 2018-08-26 DIAGNOSIS — M549 Dorsalgia, unspecified: Secondary | ICD-10-CM

## 2018-08-26 DIAGNOSIS — M542 Cervicalgia: Secondary | ICD-10-CM

## 2018-08-26 DIAGNOSIS — M419 Scoliosis, unspecified: Secondary | ICD-10-CM | POA: Diagnosis not present

## 2018-08-26 DIAGNOSIS — Z720 Tobacco use: Secondary | ICD-10-CM | POA: Diagnosis not present

## 2018-08-26 NOTE — Progress Notes (Signed)
Office Visit Note   Patient: Carrie Villa           Date of Birth: 05/24/1946           MRN: [id number] Visit Date: 08/26/2018              Requested by: Eustaquio Maize, MD [place], [place] [id number] PCP: Eustaquio Maize, MD   Assessment & Plan: Visit Diagnoses:  1. Neck pain     Plan: Patient has chronic neck and back pain or symptoms left arm.  She does have significant scoliosis and plain radiograph showed C6-7 spondylosis.  We will obtain an  Cervical MRI scan and see her back after the scan of her cervical spine.  Follow-Up Instructions: No follow-ups on file.   Orders:  Orders Placed This Encounter  Procedures  . MR Cervical Spine w/o contrast   No orders of the defined types were placed in this encounter.     Procedures: No procedures performed   Clinical Data: No additional findings.   Subjective: Chief Complaint  Patient presents with  . Neck - Pain  . Lower Back - Pain    HPI 73 year old female with greater than 5 years chronic neck pain and buttocks pain.  She states recently pains and tingling is gone down to her hand and left finger.  Patient states she also has pain that radiates down her left leg sharp to her toes the back of her heels with some numbness.  Patient is been through therapy in the past years but did not get any relief.  She is used icy hot she states the pain is 8 out of 10 makes her nauseous.  She has a 1 pack/day smoker.  She has had previous x-rays last year cervical spine and the year before lumbar spine.  Patient has significant rightward scoliosis of the lumbar spine with degenerative changes.  C-spine shows disc space narrowing particularly at C6-7 level with collapse of disc space and spurring.  No associated bowel or bladder symptoms.  Review of Systems positive for GERD 1 pack/day smoking long-term.  Positive for depression on Celexa.  Neck and back pain otherwise -14 point systems as it pertains to  HPI.   Objective: Vital Signs: BP 139/85   Pulse 65   Ht 5\' 4"  (1.626 m)   Wt 149 lb (67.6 kg)   BMI 25.58 kg/m   Physical Exam Constitutional:      Appearance: She is well-developed.  HENT:     Head: Normocephalic.     Right Ear: External ear normal.     Left Ear: External ear normal.  Eyes:     Pupils: Pupils are equal, round, and reactive to light.  Neck:     Thyroid: No thyromegaly.     Trachea: No tracheal deviation.  Cardiovascular:     Rate and Rhythm: Normal rate.  Pulmonary:     Effort: Pulmonary effort is normal.  Abdominal:     Palpations: Abdomen is soft.  Skin:    General: Skin is warm and dry.  Neurological:     Mental Status: She is alert and oriented to person, place, and time.  Psychiatric:        Behavior: Behavior normal.     Ortho Exam patient is able to heel and toe walk with heel walking she has left leg pain.  Positive straight leg raising on the left at 80 degrees she has sciatic notch tenderness some tenderness of  the paralumbar muscles minimal trochanteric bursal tenderness quad anterior tib weakness.  With brachial plexus tenderness she has index finger tremor.  She complains of numbness over the small finger negative Tinel's over the cubital tunnel on the left and right.  No thenar or hyperthenar atrophy biceps triceps is strong.  She has brisk symmetrical reflexes with to be lower extremity clonus.  Wrist extension weakness finger extension weakness and no interosseous weakness or grip weakness right or left. Specialty Comments:  No specialty comments available.  Imaging: No results found.   PMFS History: Patient Active Problem List   Diagnosis Date Noted  . 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 Anxiety   . Arthritis   . Cancer [facility])    Cervix    Family History  Problem Relation Age of Onset  . Cancer Mother     Past Surgical History:  Procedure Laterality Date  . ABDOMINAL HYSTERECTOMY      Social History   Occupational History  . Not on file  Tobacco Use  . Smoking status: Current Every Day Smoker    Packs/day: 0.50    Types: Cigarettes  . Smokeless tobacco: Never Used  Substance and Sexual Activity  . Alcohol use: No  . Drug use: No  . Sexual activity: Yes

## 2018-08-31 DIAGNOSIS — M4802 Spinal stenosis, cervical region: Secondary | ICD-10-CM | POA: Diagnosis not present

## 2018-08-31 DIAGNOSIS — M4722 Other spondylosis with radiculopathy, cervical region: Secondary | ICD-10-CM | POA: Diagnosis not present

## 2018-08-31 DIAGNOSIS — M542 Cervicalgia: Secondary | ICD-10-CM | POA: Diagnosis not present

## 2018-08-31 DIAGNOSIS — Z9181 History of falling: Secondary | ICD-10-CM | POA: Diagnosis not present

## 2018-09-02 ENCOUNTER — Encounter (INDEPENDENT_AMBULATORY_CARE_PROVIDER_SITE_OTHER): Payer: Self-pay | Admitting: Orthopaedic Surgery

## 2018-09-02 ENCOUNTER — Ambulatory Visit (INDEPENDENT_AMBULATORY_CARE_PROVIDER_SITE_OTHER): Payer: MEDICARE | Admitting: Orthopaedic Surgery

## 2018-09-02 VITALS — BP 133/83 | HR 63 | Ht 64.0 in | Wt 149.0 lb

## 2018-09-02 DIAGNOSIS — M4722 Other spondylosis with radiculopathy, cervical region: Secondary | ICD-10-CM | POA: Diagnosis not present

## 2018-09-02 DIAGNOSIS — M4802 Spinal stenosis, cervical region: Secondary | ICD-10-CM

## 2018-09-02 DIAGNOSIS — M542 Cervicalgia: Secondary | ICD-10-CM

## 2018-09-02 NOTE — Progress Notes (Signed)
Office Visit Note   Patient: Carrie Villa           Date of Birth: [birth year]           MRN: [id number] Visit Date: 09/02/2018              Requested by: Eustaquio Maize, [place], [place] [address] PCP: Claretta Fraise, MD   Assessment & Plan: Visit Diagnoses:  1. Neck pain   2. Other spondylosis with radiculopathy, cervical region   3. Foraminal stenosis of cervical region     Plan: She has multilevel involvement.  Her pain radiates down to the ulnar side of her left hand and she has severe foraminal stenosis multilevels on the left side.  I would recommend proceeding with EMGs nerve conduction velocities to see if she also has cubital tunnel syndrome.  Office follow-up after test for review.  Follow-Up Instructions: No follow-ups on file.   Orders:  Orders Placed This Encounter  Procedures  . Ambulatory referral to Physical Medicine Rehab   No orders of the defined types were placed in this encounter.     Procedures: No procedures performed   Clinical Data: No additional findings.   Subjective: Chief Complaint  Patient presents with  . Neck - Follow-up    MRI Cervical Spine Review    HPI 73 year old female returns with ongoing chronic neck pain and left arm pain.  Plain radiograph showed C6-7 spondylosis and MRI scan is been obtained and is available for review.  Pain is been present for greater than 5 years.  States that time her pain is 8 out of 10.  No fever chills no bowel or bladder symptoms no myelopathic symptoms.  Past therapy treatment, anti-inflammatories have not been effective.  Review of Systems 14 point review of systems unchanged from 08/26/2018 other than as mentioned in HPI.   Objective: Vital Signs: BP 133/83   Pulse 63   Ht 5\' 4"  (1.626 m)   Wt 149 lb (67.6 kg)   BMI 25.58 kg/m   Physical Exam Constitutional:      Appearance: She is well-developed.  HENT:     Head: Normocephalic.     Right Ear: External ear normal.     Left Ear: External ear normal.  Eyes:     Pupils: Pupils are equal, round, and reactive to light.  Neck:     Thyroid: No thyromegaly.     Trachea: No tracheal deviation.  Cardiovascular:     Rate and Rhythm: Normal rate.  Pulmonary:     Effort: Pulmonary effort is normal.  Abdominal:     Palpations: Abdomen is soft.  Skin:    General: Skin is warm and dry.  Neurological:     Mental Status: She is alert and oriented to person, place, and time.  Psychiatric:        Behavior: Behavior normal.     Ortho Exam lower extremity hyperreflexia.  She has pain with palpation of brachial plexus on the left side minimal on the right.  With palpation of the brachial plexus she still has a wrist and finger tremor.  Positive Tinel's over both cubital tunnel.  No interossei weakness.  Specialty Comments:  No specialty comments available.  Imaging: Cervical MRI scan 08/31/2018 shows asymmetric multilevel severe left facet arthropathy superimposed acute inflammatory changes left C3-4 facet and associated paraspinal muscle strain.  No fracture or malalignment.  Mild canal stenosis C6-7.  Neuroforaminal narrowing C3-4 through C6-7 severe on  the left at C4-5 and see 6 7.   PMFS History: Patient Active Problem List   Diagnosis Date Noted  . 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 Anxiety   . Arthritis   . Cancer [facility])    Cervix    Family History  Problem Relation Age of Onset  . Cancer Mother     Past Surgical History:  Procedure Laterality Date  . ABDOMINAL HYSTERECTOMY     Social History   Occupational History  . Not on file  Tobacco Use  . Smoking status: Current Every Day Smoker    Packs/day: 0.50    Types: Cigarettes  . Smokeless tobacco: Never Used  Substance and Sexual Activity  . Alcohol use: No  . Drug use: No  . Sexual activity: Yes

## 2018-09-05 ENCOUNTER — Other Ambulatory Visit: Payer: Self-pay | Admitting: Pediatrics

## 2018-09-05 ENCOUNTER — Encounter (INDEPENDENT_AMBULATORY_CARE_PROVIDER_SITE_OTHER): Payer: Self-pay | Admitting: Orthopaedic Surgery

## 2018-09-05 DIAGNOSIS — F419 Anxiety disorder, unspecified: Secondary | ICD-10-CM

## 2018-09-05 DIAGNOSIS — F339 Major depressive disorder, recurrent, unspecified: Secondary | ICD-10-CM

## 2018-09-17 ENCOUNTER — Ambulatory Visit (INDEPENDENT_AMBULATORY_CARE_PROVIDER_SITE_OTHER): Payer: MEDICARE | Admitting: Physical Medicine and Rehabilitation

## 2018-09-17 ENCOUNTER — Encounter (INDEPENDENT_AMBULATORY_CARE_PROVIDER_SITE_OTHER): Payer: Self-pay | Admitting: Physical Medicine and Rehabilitation

## 2018-09-17 DIAGNOSIS — R202 Paresthesia of skin: Secondary | ICD-10-CM | POA: Diagnosis not present

## 2018-09-17 NOTE — Progress Notes (Signed)
.  Numeric Pain Rating Scale and Functional Assessment Average Pain 0   In the last MONTH (on 0-10 scale) has pain interfered with the following?  1. General activity like being  able to carry out your everyday physical activities such as walking, climbing stairs, carrying groceries, or moving a chair?  Rating(6)    

## 2018-09-21 NOTE — Procedures (Signed)
EMG & NCV Findings: Evaluation of the left median (across palm) sensory nerve showed prolonged distal peak latency (Wrist, 3.8 ms) and prolonged distal peak latency (Palm, 2.2 ms).  All remaining nerves (as indicated in the following tables) were within normal limits.    Needle evaluation of the left pronator teres and the left Ext Digitorum muscles showed increased insertional activity.  All remaining muscles (as indicated in the following table) showed no evidence of electrical instability.    Impression: The above electrodiagnostic study is ABNORMAL and reveals evidence of a mild left median nerve entrapment at the wrist (carpal tunnel syndrome) affecting sensory components.  There is also evidence suggestive but not fully diagnostic of very mild chronic C7 radiculopathy on the left.    There is no significant electrodiagnostic evidence of any other focal nerve entrapment or brachial plexopathy.   Recommendations: 1.  Follow-up with referring physician. 2.  Continue current management of symptoms.  ___________________________ Laurence Spates FAAPMR Board Certified, American Board of Physical Medicine and Rehabilitation    Nerve Conduction Studies Anti Sensory Summary Table   Stim Site NR Peak (ms) Norm Peak (ms) P-T Amp (V) Norm P-T Amp Site1 Site2 Delta-P (ms) Dist (cm) Vel (m/s) Norm Vel (m/s)  Left Median Acr Palm Anti Sensory (2nd Digit)  30.8C  Wrist    *3.8 <3.6 18.9 >10 Wrist Palm 1.6 0.0    Palm    *2.2 <2.0 16.0         Left Radial Anti Sensory (Base 1st Digit)  30.7C  Wrist    2.4 <3.1 20.9  Wrist Base 1st Digit 2.4 0.0    Left Ulnar Anti Sensory (5th Digit)  30.9C  Wrist    3.6 <3.7 22.4 >15.0 Wrist 5th Digit 3.6 14.0 39 >38   Motor Summary Table   Stim Site NR Onset (ms) Norm Onset (ms) O-P Amp (mV) Norm O-P Amp Site1 Site2 Delta-0 (ms) Dist (cm) Vel (m/s) Norm Vel (m/s)  Left Median Motor (Abd Poll Brev)  31C  Wrist    3.7 <4.2 5.5 >5 Elbow Wrist 4.3 21.5 50 >50    Elbow    8.0  5.3         Left Ulnar Motor (Abd Dig Min)  31.1C  Wrist    3.4 <4.2 6.9 >3 B Elbow Wrist 3.5 19.5 56 >53  B Elbow    6.9  7.3  A Elbow B Elbow 1.5 10.0 67 >53  A Elbow    8.4  7.4          EMG   Side Muscle Nerve Root Ins Act Fibs Psw Amp Dur Poly Recrt Int Fraser Din Comment  Left Abd Poll Brev Median C8-T1 Nml Nml Nml Nml Nml 0 Nml Nml   Left 1stDorInt Ulnar C8-T1 Nml Nml Nml Nml Nml 0 Nml Nml   Left PronatorTeres Median C6-7 *Incr Nml Nml Nml Nml 0 Nml Nml   Left Biceps Musculocut C5-6 Nml Nml Nml Nml Nml 0 Nml Nml   Left Deltoid Axillary C5-6 Nml Nml Nml Nml Nml 0 Nml Nml   Left Ext Digitorum  Radial (Post Int) C7-8 *Incr Nml Nml Nml Nml 0 Nml Nml     Nerve Conduction Studies Anti Sensory Left/Right Comparison   Stim Site L Lat (ms) R Lat (ms) L-R Lat (ms) L Amp (V) R Amp (V) L-R Amp (%) Site1 Site2 L Vel (m/s) R Vel (m/s) L-R Vel (m/s)  Median Acr Palm Anti Sensory (2nd Digit)  30.[place]  Wrist *3.8   18.9   Wrist Palm     Palm *2.2   16.0         Radial Anti Sensory (Base 1st Digit)  30.7C  Wrist 2.4   20.9   Wrist Base 1st Digit     Ulnar Anti Sensory (5th Digit)  30.9C  Wrist 3.6   22.4   Wrist 5th Digit 39     Motor Left/Right Comparison   Stim Site L Lat (ms) R Lat (ms) L-R Lat (ms) L Amp (mV) R Amp (mV) L-R Amp (%) Site1 Site2 L Vel (m/s) R Vel (m/s) L-R Vel (m/s)  Median Motor (Abd Poll Brev)  31C  Wrist 3.7   5.5   Elbow Wrist 50    Elbow 8.0   5.3         Ulnar Motor (Abd Dig Min)  31.1C  Wrist 3.4   6.9   B Elbow Wrist 56    B Elbow 6.9   7.3   A Elbow B Elbow 67    A Elbow 8.4   7.4            Waveforms:

## 2018-09-21 NOTE — Progress Notes (Signed)
Carrie Villa - 73 y.o. female MRN [id number]  Date of birth: Dec 28, 1945  Office Visit Note: Visit Date: 09/17/2018 PCP: Claretta Fraise, MD Referred by: Claretta Fraise, MD  Subjective: Chief Complaint  Patient presents with  . Neck - Numbness, Tingling  . Left Shoulder - Numbness, Tingling  . Left Arm - Numbness, Tingling  . Left Hand - Numbness, Tingling   HPI: Carrie Villa is a 73 y.o. female who comes in today At the request of Dr. Rodell Perna for electrodiagnostic study of the left upper arm.  Patient reports 1 year of chronic worsening neck pain with referral in the left shoulder and left arm and left hand particularly in the fifth digit but also sometimes the thumb.  She does get a lot of numbness in the hands with some nocturnal complaints.  She is right-hand dominant.  She has had no prior electrodiagnostic study.  She denies any symptoms in the right hand.  She reports worsening which can just be intermittent and she reports symptoms are fairly constant.  ROS Otherwise per HPI.  Assessment & Plan: Visit Diagnoses:  1. Paresthesia of skin     Plan: Impression: The above electrodiagnostic study is ABNORMAL and reveals evidence of a mild left median nerve entrapment at the wrist (carpal tunnel syndrome) affecting sensory components.  There is also evidence suggestive but not fully diagnostic of very mild chronic C7 radiculopathy on the left.    There is no significant electrodiagnostic evidence of any other focal nerve entrapment or brachial plexopathy.   Recommendations: 1.  Follow-up with referring physician. 2.  Continue current management of symptoms.   Meds & Orders: No orders of the defined types were placed in this encounter.   Orders Placed This Encounter  Procedures  . NCV with EMG (electromyography)    Follow-up: Return for Rodell Perna, M.D..   Procedures: No procedures performed  EMG & NCV Findings: Evaluation of the left median (across palm) sensory  nerve showed prolonged distal peak latency (Wrist, 3.8 ms) and prolonged distal peak latency (Palm, 2.2 ms).  All remaining nerves (as indicated in the following tables) were within normal limits.    Needle evaluation of the left pronator teres and the left Ext Digitorum muscles showed increased insertional activity.  All remaining muscles (as indicated in the following table) showed no evidence of electrical instability.    Impression: The above electrodiagnostic study is ABNORMAL and reveals evidence of a mild left median nerve entrapment at the wrist (carpal tunnel syndrome) affecting sensory components.  There is also evidence suggestive but not fully diagnostic of very mild chronic C7 radiculopathy on the left.    There is no significant electrodiagnostic evidence of any other focal nerve entrapment or brachial plexopathy.   Recommendations: 1.  Follow-up with referring physician. 2.  Continue current management of symptoms.  ___________________________ Laurence Spates FAAPMR Board Certified, American Board of Physical Medicine and Rehabilitation    Nerve Conduction Studies Anti Sensory Summary Table   Stim Site NR Peak (ms) Norm Peak (ms) P-T Amp (V) Norm P-T Amp Site1 Site2 Delta-P (ms) Dist (cm) Vel (m/s) Norm Vel (m/s)  Left Median Acr Palm Anti Sensory (2nd Digit)  30.8C  Wrist    *3.8 <3.6 18.9 >10 Wrist Palm 1.6 0.0    Palm    *2.2 <2.0 16.0         Left Radial Anti Sensory (Base 1st Digit)  30.7C  Wrist    2.4 <3.1 20.9  Wrist Base  1st Digit 2.4 0.0    Left Ulnar Anti Sensory (5th Digit)  30.9C  Wrist    3.6 <3.7 22.4 >15.0 Wrist 5th Digit 3.6 14.0 39 >38   Motor Summary Table   Stim Site NR Onset (ms) Norm Onset (ms) O-P Amp (mV) Norm O-P Amp Site1 Site2 Delta-0 (ms) Dist (cm) Vel (m/s) Norm Vel (m/s)  Left Median Motor (Abd Poll Brev)  31C  Wrist    3.7 <4.2 5.5 >5 Elbow Wrist 4.3 21.5 50 >50  Elbow    8.0  5.3         Left Ulnar Motor (Abd Dig Min)  31.1C    Wrist    3.4 <4.2 6.9 >3 B Elbow Wrist 3.5 19.5 56 >53  B Elbow    6.9  7.3  A Elbow B Elbow 1.5 10.0 67 >53  A Elbow    8.4  7.4          EMG   Side Muscle Nerve Root Ins Act Fibs Psw Amp Dur Poly Recrt Int Fraser Din Comment  Left Abd Poll Brev Median C8-T1 Nml Nml Nml Nml Nml 0 Nml Nml   Left 1stDorInt Ulnar C8-T1 Nml Nml Nml Nml Nml 0 Nml Nml   Left PronatorTeres Median C6-7 *Incr Nml Nml Nml Nml 0 Nml Nml   Left Biceps Musculocut C5-6 Nml Nml Nml Nml Nml 0 Nml Nml   Left Deltoid Axillary C5-6 Nml Nml Nml Nml Nml 0 Nml Nml   Left Ext Digitorum  Radial (Post Int) C7-8 *Incr Nml Nml Nml Nml 0 Nml Nml     Nerve Conduction Studies Anti Sensory Left/Right Comparison   Stim Site L Lat (ms) R Lat (ms) L-R Lat (ms) L Amp (V) R Amp (V) L-R Amp (%) Site1 Site2 L Vel (m/s) R Vel (m/s) L-R Vel (m/s)  Median Acr Palm Anti Sensory (2nd Digit)  30.8C  Wrist *3.8   18.9   Wrist Palm     Palm *2.2   16.0         Radial Anti Sensory (Base 1st Digit)  30.7C  Wrist 2.4   20.9   Wrist Base 1st Digit     Ulnar Anti Sensory (5th Digit)  30.9C  Wrist 3.6   22.4   Wrist 5th Digit 39     Motor Left/Right Comparison   Stim Site L Lat (ms) R Lat (ms) L-R Lat (ms) L Amp (mV) R Amp (mV) L-R Amp (%) Site1 Site2 L Vel (m/s) R Vel (m/s) L-R Vel (m/s)  Median Motor (Abd Poll Brev)  31C  Wrist 3.7   5.5   Elbow Wrist 50    Elbow 8.0   5.3         Ulnar Motor (Abd Dig Min)  31.1C  Wrist 3.4   6.9   B Elbow Wrist 56    B Elbow 6.9   7.3   A Elbow B Elbow 67    A Elbow 8.4   7.4            Waveforms:            Clinical History: No specialty comments available.   She reports that she has been smoking cigarettes. She has been smoking about 0.50 packs per day. She has never used smokeless tobacco. No results for input(s): HGBA1C, LABURIC in the last 8760 hours.  Objective:  VS:  HT:    WT:   BMI:     BP:  HR: bpm  TEMP: ( )  RESP:  Physical Exam Musculoskeletal:        General: No  swelling, tenderness or deformity.     Comments: Inspection reveals no atrophy of the bilateral APB or FDI or hand intrinsics. There is no swelling, color changes, allodynia or dystrophic changes. There is 5 out of 5 strength in the bilateral wrist extension, finger abduction and long finger flexion. There is intact sensation to light touch in all dermatomal and peripheral nerve distributions. . There is a negative Hoffmann's test bilaterally.  Skin:    General: Skin is warm and dry.     Findings: No erythema or rash.  Neurological:     General: No focal deficit present.     Mental Status: She is alert and oriented to person, place, and time.     Sensory: No sensory deficit.     Motor: No weakness or abnormal muscle tone.     Coordination: Coordination normal.  Psychiatric:        Mood and Affect: Mood normal.        Behavior: Behavior normal.     Ortho Exam Imaging: No results found.  Past Medical/Family/Surgical/Social History: Medications & Allergies reviewed per EMR, new medications updated. Patient Active Problem List   Diagnosis Date Noted  . 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 Anxiety   . Arthritis   . Cancer [facility])    Cervix   Family History  Problem Relation Age of Onset  . Cancer Mother    Past Surgical History:  Procedure Laterality Date  . ABDOMINAL HYSTERECTOMY     Social History   Occupational History  . Not on file  Tobacco Use  . Smoking status: Current Every Day Smoker    Packs/day: 0.50    Types: Cigarettes  . Smokeless tobacco: Never Used  Substance and Sexual Activity  . Alcohol use: No  . Drug use: No  . Sexual activity: Yes

## 2018-09-23 ENCOUNTER — Encounter (INDEPENDENT_AMBULATORY_CARE_PROVIDER_SITE_OTHER): Payer: Self-pay | Admitting: Orthopaedic Surgery

## 2018-09-23 ENCOUNTER — Ambulatory Visit (INDEPENDENT_AMBULATORY_CARE_PROVIDER_SITE_OTHER): Payer: MEDICARE | Admitting: Orthopaedic Surgery

## 2018-09-23 VITALS — BP 143/85 | HR 73 | Ht 64.0 in | Wt 149.0 lb

## 2018-09-23 DIAGNOSIS — G5602 Carpal tunnel syndrome, left upper limb: Secondary | ICD-10-CM

## 2018-09-23 DIAGNOSIS — M47812 Spondylosis without myelopathy or radiculopathy, cervical region: Secondary | ICD-10-CM | POA: Diagnosis not present

## 2018-09-23 DIAGNOSIS — M4802 Spinal stenosis, cervical region: Secondary | ICD-10-CM | POA: Diagnosis not present

## 2018-09-23 NOTE — Progress Notes (Signed)
Office Visit Note   Patient: Carrie Villa           Date of Birth: 1945/08/28           MRN: [id number] Visit Date: 09/23/2018              Requested by: Claretta Fraise, MD [place], [place] [address] PCP: Claretta Fraise, MD   Assessment & Plan: Visit Diagnoses:  1. Carpal tunnel syndrome, left upper limb   2. Cervical spondylosis   3. Foraminal stenosis of cervical region     Plan: Discussed options patient's had progressive symptoms with significant arm pain and weakness.  Carpal tunnel symptoms wake her up at night.  She rates her pain is severe at 8 out of 10 patient has had progressive pain symptoms now for more than 4 years.  Patient has been through therapy anti-inflammatories, traction, prednisone Dosepak.  States she is ready to proceed with operative intervention.  Plan would be C5-6, C6-7 2 level cervical fusion allograft and plate.  Left carpal tunnel release.  Procedure discussed with patient's questions were elicited and answered reviewed her MRI scan as well as EMGs nerve conduction velocities copy of results were given.  Follow-Up Instructions: No follow-ups on file.   Orders:  No orders of the defined types were placed in this encounter.  No orders of the defined types were placed in this encounter.     Procedures: No procedures performed   Clinical Data: No additional findings.   Subjective: Chief Complaint  Patient presents with  . Neck - Pain, Follow-up    EMG/NCS review  . Left Arm - Pain, Follow-up    HPI 73 year old female returns with persistent problems with cervical spondylosis and radiculopathy.  She been treated in the past with topical creams anti-inflammatories, prednisone Dosepaks, epidural steroid injections.  Symptoms have progressed and MRI scan showed cervical spondylosis with central stenosis C6-7 and severe foraminal stenosis C5-6 C6-7.  Due to her persistent problems the ulnar aspect of her hands EMGs nerve conduction  velocities were obtained which shows chronic C7 radiculopathy on the left as well as mild left median nerve entrapment.  Ulnar nerve at the elbow was normal as well as in Guyon's canal.  He has been symptomatic for more than 5 years with progression and states pain is progressed to 8 out of 10 and she wants to proceed with surgical intervention.  Review of Systems 14 point review of system positive for GERD, 1 pack/day smoker long-term, positive for depression on Celexa.  Back pain neck pain.  Positive for C7 radiculopathy on the left.  Left median nerve compression at the wrist consistent with carpal tunnel syndrome.   Objective: Vital Signs: BP (!) 143/85   Pulse 73   Ht 5\' 4"  (1.626 m)   Wt 149 lb (67.6 kg)   BMI 25.58 kg/m   Physical Exam Constitutional:      Appearance: She is well-developed.  HENT:     Head: Normocephalic.     Right Ear: External ear normal.     Left Ear: External ear normal.  Eyes:     Pupils: Pupils are equal, round, and reactive to light.  Neck:     Thyroid: No thyromegaly.     Trachea: No tracheal deviation.  Cardiovascular:     Rate and Rhythm: Normal rate.  Pulmonary:     Effort: Pulmonary effort is normal.  Abdominal:     Palpations: Abdomen is soft.  Skin:  General: Skin is warm and dry.  Neurological:     Mental Status: She is alert and oriented to person, place, and time.  Psychiatric:        Behavior: Behavior normal.     Ortho Exam patient is increased pain with cervical compression some relief with distraction.  Brachial plexus tenderness on the left negative on the right.  Positive Spurling on the left.  Positive Tinel's over both carpal tunnel worse on the left than right.  No interosseous weakness no wrist flexion weakness.  No shoulder impingement elbows reach full extension good grip strength.  Specialty Comments:  EMGs nerve conduction velocity shows mild left median nerve entrapment at the wrist consistent with carpal tunnel  syndrome.  Mild chronic C7 radiculopathy on the left.  Imaging: MRI scan cervical from 09/06/2018 demonstrates cervical spondylosis C5-6 C6-7 C6-7 cord flattening and severe left foraminal stenosis.   PMFS History: Patient Active Problem List   Diagnosis Date Noted  . Foraminal stenosis of cervical region 09/24/2018  . Carpal tunnel syndrome, left upper limb 09/24/2018  . 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 Anxiety   . Arthritis   . Cancer [facility])    Cervix    Family History  Problem Relation Age of Onset  . Cancer Mother     Past Surgical History:  Procedure Laterality Date  . ABDOMINAL HYSTERECTOMY     Social History   Occupational History  . Not on file  Tobacco Use  . Smoking status: Current Every Day Smoker    Packs/day: 0.50    Types: Cigarettes  . Smokeless tobacco: Never Used  Substance and Sexual Activity  . Alcohol use: No  . Drug use: No  . Sexual activity: Yes

## 2018-09-24 ENCOUNTER — Encounter (INDEPENDENT_AMBULATORY_CARE_PROVIDER_SITE_OTHER): Payer: Self-pay | Admitting: Orthopaedic Surgery

## 2018-09-24 DIAGNOSIS — G5602 Carpal tunnel syndrome, left upper limb: Secondary | ICD-10-CM | POA: Insufficient documentation

## 2018-09-24 DIAGNOSIS — M4802 Spinal stenosis, cervical region: Secondary | ICD-10-CM | POA: Insufficient documentation

## 2018-09-30 ENCOUNTER — Ambulatory Visit (INDEPENDENT_AMBULATORY_CARE_PROVIDER_SITE_OTHER): Payer: MEDICARE | Admitting: Orthopaedic Surgery

## 2018-09-30 ENCOUNTER — Telehealth (INDEPENDENT_AMBULATORY_CARE_PROVIDER_SITE_OTHER): Payer: Self-pay | Admitting: Radiology

## 2018-09-30 NOTE — Telephone Encounter (Signed)
Patient called stating she has not received any news in regards to surgery. Please call patient to advise.  CB [phone]

## 2018-10-02 ENCOUNTER — Other Ambulatory Visit: Payer: Self-pay | Admitting: Pediatrics

## 2018-10-02 DIAGNOSIS — K219 Gastro-esophageal reflux disease without esophagitis: Secondary | ICD-10-CM

## 2018-10-14 ENCOUNTER — Ambulatory Visit (INDEPENDENT_AMBULATORY_CARE_PROVIDER_SITE_OTHER): Payer: MEDICARE | Admitting: Surgery

## 2018-10-14 ENCOUNTER — Encounter (HOSPITAL_COMMUNITY): Payer: Self-pay

## 2018-10-14 ENCOUNTER — Encounter (INDEPENDENT_AMBULATORY_CARE_PROVIDER_SITE_OTHER): Payer: Self-pay | Admitting: Surgery

## 2018-10-14 ENCOUNTER — Other Ambulatory Visit: Payer: Self-pay

## 2018-10-14 ENCOUNTER — Encounter (HOSPITAL_COMMUNITY)
Admission: RE | Admit: 2018-10-14 | Discharge: 2018-10-14 | Disposition: A | Discharge status: Home / Self Care | Payer: MEDICARE | Source: Ambulatory Visit | Attending: Orthopaedic Surgery | Admitting: Orthopaedic Surgery

## 2018-10-14 VITALS — BP 151/77 | HR 63 | Ht 64.0 in | Wt 148.0 lb

## 2018-10-14 DIAGNOSIS — Z01812 Encounter for preprocedural laboratory examination: Secondary | ICD-10-CM | POA: Insufficient documentation

## 2018-10-14 DIAGNOSIS — G5602 Carpal tunnel syndrome, left upper limb: Secondary | ICD-10-CM

## 2018-10-14 DIAGNOSIS — M47812 Spondylosis without myelopathy or radiculopathy, cervical region: Secondary | ICD-10-CM

## 2018-10-14 HISTORY — DX: Cardiac murmur, unspecified: R01.1

## 2018-10-14 HISTORY — DX: Personal history of other medical treatment: Z92.89

## 2018-10-14 HISTORY — DX: Gastro-esophageal reflux disease without esophagitis: K21.9

## 2018-10-14 HISTORY — DX: Nontoxic goiter, unspecified: E04.9

## 2018-10-14 HISTORY — DX: Carpal tunnel syndrome, unspecified upper limb: G56.00

## 2018-10-14 HISTORY — DX: Depression, unspecified: F32.A

## 2018-10-14 HISTORY — DX: Adverse effect of unspecified anesthetic, initial encounter: T41.45XA

## 2018-10-14 HISTORY — DX: Major depressive disorder, single episode, unspecified: F32.9

## 2018-10-14 HISTORY — DX: Other complications of anesthesia, initial encounter: T88.59XA

## 2018-10-14 LAB — CBC
HEMATOCRIT: 43.4 % (ref 36.0–46.0)
HEMOGLOBIN: 13.5 g/dL (ref 12.0–15.0)
MCH: 29.7 pg (ref 26.0–34.0)
MCHC: 31.1 g/dL (ref 30.0–36.0)
MCV: 95.4 fL (ref 80.0–100.0)
Platelets: 110 10*3/uL — ABNORMAL LOW (ref 150–400)
RBC: 4.55 MIL/uL (ref 3.87–5.11)
RDW: 13.1 % (ref 11.5–15.5)
WBC: 8.9 10*3/uL (ref 4.0–10.5)
nRBC: 0 % (ref 0.0–0.2)

## 2018-10-14 LAB — BASIC METABOLIC PANEL
Anion gap: 6 (ref 5–15)
BUN: 5 mg/dL — ABNORMAL LOW (ref 8–23)
CO2: 25 mmol/L (ref 22–32)
CREATININE: 0.74 mg/dL (ref 0.44–1.00)
Calcium: 9.4 mg/dL (ref 8.9–10.3)
Chloride: 109 mmol/L (ref 98–111)
GFR calc Af Amer: >60 mL/min (ref >60)
GFR calc non Af Amer: >60 mL/min (ref >60)
Glucose, Bld: 99 mg/dL (ref 70–99)
Potassium: 3.9 mmol/L (ref 3.5–5.1)
Sodium: 140 mmol/L (ref 135–145)

## 2018-10-14 LAB — SURGICAL PCR SCREEN
MRSA, PCR: NEGATIVE
Staphylococcus aureus: NEGATIVE

## 2018-10-14 NOTE — Progress Notes (Signed)
73 year old female history of C5-6 and C6-7 HNP/stenosis and left carpal tunnel syndrome comes in for preop evaluation.  Progressively worsening symptoms.  Failed conservative treatment.  She is want to proceed with C5-6 and C6-7 ACDF and left carpal tunnel release scheduled.  Surgical procedure discussed and all questions answered.  Full history and physical performed today.

## 2018-10-14 NOTE — Progress Notes (Signed)
PCP - Dr. Assunta Found  Cardiologist - No  Chest x-ray - NA  EKG - NA  Stress Test -   ECHO - yes  Ago- has a heart murmer- no follow up needed.  I requested ECHO from [facility], [place]  Cardiac Cath - NA  Sleep Study - na CPAP - na  LABS- CBC, BMP  ASA-NO  HA1C-NA Fasting Blood Sugar - NA Checks Blood Sugar _0____ times a day  Anesthesia-check echo from [facility], [place].  Pt denies having chest pain, sob, or fever at this time. All instructions explained to the pt, with a verbal understanding of the material. Pt agrees to go over the instructions while at home for a better understanding. The opportunity to ask questions was provided.

## 2018-10-14 NOTE — Pre-Procedure Instructions (Signed)
Bennette Hasty  10/14/2018    Your procedure is scheduled on Friday, March 20.   Report to [facility] Admitting at 5:30 AM                     Your surgery or procedure is scheduled for 7:30 A.M.   Call this number if you have problems the morning of surgery: [phone]  This is the number for the Pre- Surgical Desk.                     For any other questions, please call [phone], Monday - Friday 8 AM - 4 PM.    Remember:  Do not eat or drink after midnight Thursday, March 19.   Take these medicines the morning of surgery with A SIP OF WATER :  busPIRone (BUSPAR)             citalopram (CELEXA)             pantoprazole (PROTONIX  1 Week prior to surgery STOP taking Aspirin, Aspirin Products (Goody Powder, Excedrin Migraine), Ibuprofen (Advil), Naproxen (Aleve), Vitamins and Herbal Products (ie Fish Oil).  Special instructions:   [place]- Preparing For Surgery  Before surgery, you can play an important role. Because skin is not sterile, your skin needs to be as free of germs as possible. You can reduce the number of germs on your skin by washing with CHG (chlorahexidine gluconate) Soap before surgery.  CHG is an antiseptic cleaner which kills germs and bonds with the skin to continue killing germs even after washing.    Oral Hygiene is also important to reduce your risk of infection.  Remember - BRUSH YOUR TEETH THE MORNING OF SURGERY WITH YOUR REGULAR TOOTHPASTE  Please do not use if you have an allergy to CHG or antibacterial soaps. If your skin becomes reddened/irritated stop using the CHG.  Do not shave (including legs and underarms) for at least 48 hours prior to first CHG shower. It is OK to shave your face.  Please follow these instructions carefully.   1. Shower the NIGHT BEFORE SURGERY and the MORNING OF SURGERY with CHG.   2. If you chose to wash your hair, wash your hair first as usual with your normal shampoo.  3. After you shampoo, wash  your face and private area with the soap you use at home, then rinse your hair and body thoroughly to remove the shampoo and soap.  4. Use CHG as you would any other liquid soap. You can apply CHG directly to the skin and wash gently with a scrungie or a clean washcloth.   5. Apply the CHG Soap to your body ONLY FROM THE NECK DOWN.  Do not use on open wounds or open sores. Avoid contact with your eyes, ears, mouth and genitals (private parts).   6. Wash thoroughly, paying special attention to the area where your surgery will be performed.  7. Thoroughly rinse your body with warm water from the neck down.  8. DO NOT shower/wash with your normal soap after using and rinsing off the CHG Soap.  9. Pat yourself dry with a CLEAN TOWEL.  10. Wear CLEAN PAJAMAS to bed the night before surgery, wear comfortable clothes the morning of surgery  11. Place CLEAN SHEETS on your bed the night of your first shower and DO NOT SLEEP WITH PETS. 12.  Day of Surgery: Shower as Instructed above,  Do not  apply any deodorants/lotions, powders, colognes.  Please wear clean clothes to the hospital/surgery center.   Remember to brush your teeth WITH YOUR REGULAR TOOTHPASTE  Do not wear jewelry, make-up or nail polish.  Do not shave 48 hours prior to surgery.  Men may shave face and neck.  Do not bring valuables to the hospital.  [facility] is not responsible for any belongings or valuables.  Contacts, dentures or bridgework may not be worn into surgery.  Leave your suitcase in the car.  After surgery it may be brought to your room.  For patients admitted to the hospital, discharge time will be determined by your treatment team.  Patients discharged the day of surgery will not be allowed to drive home.   Please read over the following fact sheets that you were given: Pain Booklet, Patient Instructions for Mupirocin Application, Coughing and Deep Breathing, Surgical Site Infections.

## 2018-10-17 ENCOUNTER — Encounter (HOSPITAL_COMMUNITY): Payer: Self-pay | Admitting: Vascular Surgery

## 2018-10-18 ENCOUNTER — Inpatient Hospital Stay (HOSPITAL_COMMUNITY): Admission: RE | Admit: 2018-10-18 | Payer: Self-pay | Source: Ambulatory Visit

## 2018-10-22 ENCOUNTER — Ambulatory Visit (HOSPITAL_COMMUNITY): Admission: RE | Admit: 2018-10-22 | Payer: MEDICARE | Source: Home / Self Care | Admitting: Orthopaedic Surgery

## 2018-10-22 ENCOUNTER — Encounter (HOSPITAL_COMMUNITY): Admission: RE | Payer: Self-pay | Source: Home / Self Care

## 2018-10-22 SURGERY — ANTERIOR CERVICAL DECOMPRESSION/DISCECTOMY FUSION 2 LEVELS
Anesthesia: General

## 2018-12-03 ENCOUNTER — Other Ambulatory Visit: Payer: Self-pay | Admitting: Pediatrics

## 2018-12-03 DIAGNOSIS — F339 Major depressive disorder, recurrent, unspecified: Secondary | ICD-10-CM

## 2018-12-03 DIAGNOSIS — F419 Anxiety disorder, unspecified: Secondary | ICD-10-CM

## 2018-12-29 ENCOUNTER — Other Ambulatory Visit: Payer: Self-pay | Admitting: Family Medicine

## 2018-12-29 DIAGNOSIS — F339 Major depressive disorder, recurrent, unspecified: Secondary | ICD-10-CM

## 2018-12-29 DIAGNOSIS — F419 Anxiety disorder, unspecified: Secondary | ICD-10-CM

## 2018-12-30 NOTE — Telephone Encounter (Signed)
Appointment scheduled for June 2,2020.

## 2018-12-30 NOTE — Telephone Encounter (Signed)
Patient needs appointment.

## 2019-01-04 ENCOUNTER — Other Ambulatory Visit: Payer: Self-pay

## 2019-01-04 ENCOUNTER — Ambulatory Visit: Payer: MEDICARE | Admitting: Family Medicine

## 2019-01-04 ENCOUNTER — Ambulatory Visit: Payer: MEDICARE | Admitting: Pediatrics

## 2019-01-05 ENCOUNTER — Encounter: Payer: Self-pay | Admitting: Family Medicine

## 2019-01-05 ENCOUNTER — Ambulatory Visit (INDEPENDENT_AMBULATORY_CARE_PROVIDER_SITE_OTHER): Payer: MEDICARE | Admitting: Family Medicine

## 2019-01-05 VITALS — BP 132/77 | HR 72 | Temp 97.4°F | Ht 64.0 in | Wt 144.0 lb

## 2019-01-05 DIAGNOSIS — F339 Major depressive disorder, recurrent, unspecified: Secondary | ICD-10-CM

## 2019-01-05 DIAGNOSIS — Z780 Asymptomatic menopausal state: Secondary | ICD-10-CM

## 2019-01-05 DIAGNOSIS — M47812 Spondylosis without myelopathy or radiculopathy, cervical region: Secondary | ICD-10-CM

## 2019-01-05 DIAGNOSIS — F419 Anxiety disorder, unspecified: Secondary | ICD-10-CM

## 2019-01-05 DIAGNOSIS — K219 Gastro-esophageal reflux disease without esophagitis: Secondary | ICD-10-CM

## 2019-01-05 MED ORDER — PANTOPRAZOLE SODIUM 40 MG PO TBEC: 40.0000 mg | DELAYED_RELEASE_TABLET | Freq: Every day | ORAL | 1 refills | Status: DC

## 2019-01-05 MED ORDER — BUSPIRONE HCL 10 MG PO TABS: 10.0000 mg | ORAL_TABLET | Freq: Two times a day (BID) | ORAL | 1 refills | Status: DC

## 2019-01-05 MED ORDER — CITALOPRAM HYDROBROMIDE 20 MG PO TABS: 20.0000 mg | ORAL_TABLET | Freq: Every day | ORAL | 1 refills | Status: DC

## 2019-01-05 NOTE — Patient Instructions (Signed)
Use Allegra as needed for allergies

## 2019-01-05 NOTE — Progress Notes (Signed)
Subjective:  Patient ID: Carrie Villa, female    DOB: 12-Sep-1945  Age: 73 y.o. MRN: [id number]  CC: Medical Management of Chronic Issues   HPI Carrie Villa presents for establishment with me as her primary physician.  Previously she has been seen by Dr. Evette Doffing.  The patient has a history of cervical spondylosis and is due for a cervical fusion at 3 levels planned by Dr. Inda Merlin.  Unfortunately due to the [place] outbreak this is been postponed.  Patient also says she is due for her mammogram and DEXA scans those will be arranged.  Patient has allergic rhinitis symptoms including sneezing frequently sniffling, clear rhinorrhea, watery and itchy eyes. There has been no fever no chills no sweats. No earaches. There is some scratchy throat but no sore throat or difficulty swallowing. There is some nasal congestion.  Patient has had murmur for many years.  She had a normal stress test about 4 years ago.  She has had no chest pain since that time but has had palpitations.  Her gynecologic history is positive for a cervical procedure for an abnormal Pap apparent conization versus ablation she is unclear exactly what was done.  However she is overdue for repeat Pap smear by several years.  Patient has been stable with regard to her depression and anxiety.  As long as she takes her current medications she does quite well.  She needs those refills today.  Patient in for follow-up of GERD. Currently asymptomatic taking  PPI daily. There is no chest pain or heartburn. No hematemesis and no melena. No dysphagia or choking. Onset is remote. Progression is stable. Complicating factors, none.   Depression screen [facility] 2/9 01/05/2019 08/17/2018 07/20/2018  Decreased Interest 0 0 0  Down, Depressed, Hopeless 0 0 0  PHQ - 2 Score 0 0 0  Altered sleeping - - 0  Tired, decreased energy - - 2  Change in appetite - - 1  Feeling bad or failure about yourself  - - 0  Trouble concentrating - - 0  Moving slowly or  fidgety/restless - - 0  Suicidal thoughts - - 0  PHQ-9 Score - - 3  Difficult doing work/chores - - Not difficult at all    History Carrie Villa has a past medical history of Anxiety, Arthritis, Cancer ([place]), Carpal tunnel syndrome, Complication of anesthesia, Depression, GERD (gastroesophageal reflux disease), Goiter, Heart murmur, and History of blood transfusion.   She has a past surgical history that includes Colonoscopy w/ polypectomy; Abdominal hysterectomy; and Shoulder surgery (Left).   Her family history includes Cancer in her mother.She reports that she has been smoking cigarettes. She has a 45.75 pack-year smoking history. She has never used smokeless tobacco. She reports that she does not drink alcohol or use drugs.    ROS Review of Systems  Constitutional: Negative.  Negative for activity change, appetite change, chills and fever.  HENT: Positive for postnasal drip, rhinorrhea and sneezing. Negative for congestion, ear discharge, ear pain, hearing loss, nosebleeds and trouble swallowing.   Eyes: Negative for visual disturbance.  Respiratory: Negative for chest tightness and shortness of breath.   Cardiovascular: Positive for palpitations. Negative for chest pain.  Gastrointestinal: Negative for abdominal pain, constipation, diarrhea, nausea and vomiting.  Genitourinary: Negative for difficulty urinating.  Musculoskeletal: Negative for arthralgias and myalgias.  Skin: Negative for rash.  Neurological: Negative for headaches.  Psychiatric/Behavioral: Negative for sleep disturbance.    Objective:  BP 132/77   Pulse 72   Temp (!) 97.4  F (36.3 C) (Oral)   Ht 5\' 4"  (1.626 m)   Wt 144 lb (65.3 kg)   BMI 24.72 kg/m   BP Readings from Last 3 Encounters:  01/05/19 132/77  10/14/18 135/65  10/14/18 (!) 151/77    Wt Readings from Last 3 Encounters:  01/05/19 144 lb (65.3 kg)  10/14/18 145 lb 9.6 oz (66 kg)  10/14/18 148 lb (67.1 kg)     Physical Exam Constitutional:       General: She is not in acute distress.    Appearance: She is well-developed.  HENT:     Head: Normocephalic and atraumatic.     Right Ear: External ear normal.     Left Ear: External ear normal.     Nose: Nose normal.  Eyes:     Conjunctiva/sclera: Conjunctivae normal.     Pupils: Pupils are equal, round, and reactive to light.  Neck:     Musculoskeletal: Normal range of motion and neck supple.     Thyroid: No thyromegaly.  Cardiovascular:     Rate and Rhythm: Normal rate and regular rhythm.     Heart sounds: Normal heart sounds. No murmur.  Pulmonary:     Effort: Pulmonary effort is normal. No respiratory distress.     Breath sounds: Normal breath sounds. No wheezing or rales.  Abdominal:     Palpations: Abdomen is soft.     Tenderness: There is no abdominal tenderness.  Lymphadenopathy:     Cervical: No cervical adenopathy.  Skin:    General: Skin is warm and dry.  Neurological:     Mental Status: She is alert and oriented to person, place, and time.     Deep Tendon Reflexes: Reflexes are normal and symmetric.  Psychiatric:        Behavior: Behavior normal.        Thought Content: Thought content normal.        Judgment: Judgment normal.       Assessment & Plan:   Savaya was seen today for medical management of chronic issues.  Diagnoses and all orders for this visit:  Depression, recurrent ([place]) -     citalopram (CELEXA) 20 MG tablet; Take 1 tablet (20 mg total) by mouth daily. Must be seen  Cervical spondylosis  Gastroesophageal reflux disease, esophagitis presence not specified -     pantoprazole (PROTONIX) 40 MG tablet; Take 1 tablet (40 mg total) by mouth daily.  Anxiety -     busPIRone (BUSPAR) 10 MG tablet; Take 1 tablet (10 mg total) by mouth 2 (two) times daily.  Post-menopause -     DG [facility] DEXA       I have discontinued Belem Insley's mirtazapine. I have also changed her pantoprazole and busPIRone. Additionally, I am having her maintain  her (Menthol, Topical Analgesic, (ICY HOT EX)) and citalopram.  Allergies as of 01/05/2019      Reactions   Pravastatin    Foot pain at 20mg       Medication List       Accurate as of January 05, 2019  6:17 PM. If you have any questions, ask your nurse or doctor.        STOP taking these medications   mirtazapine 30 MG tablet Commonly known as:  REMERON Stopped by:  Claretta Fraise, MD     TAKE these medications   busPIRone 10 MG tablet Commonly known as:  BUSPAR Take 1 tablet (10 mg total) by mouth 2 (two) times daily.  citalopram 20 MG tablet Commonly known as:  CELEXA Take 1 tablet (20 mg total) by mouth daily. Must be seen   ICY HOT EX Apply 1 application topically daily as needed (pain).   pantoprazole 40 MG tablet Commonly known as:  PROTONIX Take 1 tablet (40 mg total) by mouth daily. What changed:    when to take this  reasons to take this        Follow-up: Return in about 6 months (around 07/07/2019).  Claretta Fraise, M.D.

## 2019-01-18 DIAGNOSIS — Z1231 Encounter for screening mammogram for malignant neoplasm of breast: Secondary | ICD-10-CM | POA: Diagnosis not present

## 2019-01-18 LAB — HM MAMMOGRAPHY

## 2019-01-29 ENCOUNTER — Other Ambulatory Visit: Payer: Self-pay | Admitting: Family Medicine

## 2019-01-29 DIAGNOSIS — F339 Major depressive disorder, recurrent, unspecified: Secondary | ICD-10-CM

## 2019-02-09 ENCOUNTER — Encounter: Payer: Self-pay | Admitting: *Deleted

## 2019-04-14 DIAGNOSIS — Z23 Encounter for immunization: Secondary | ICD-10-CM | POA: Diagnosis not present

## 2019-04-27 ENCOUNTER — Other Ambulatory Visit: Payer: Self-pay

## 2019-06-13 NOTE — Progress Notes (Signed)
Message sent to Dr. Lorin Mercy that there are no orders.

## 2019-06-14 ENCOUNTER — Other Ambulatory Visit (HOSPITAL_COMMUNITY)
Admission: RE | Admit: 2019-06-14 | Discharge: 2019-06-14 | Disposition: A | Discharge status: Home / Self Care | Payer: MEDICARE | Source: Ambulatory Visit | Attending: Orthopaedic Surgery | Admitting: Orthopaedic Surgery

## 2019-06-14 ENCOUNTER — Encounter (HOSPITAL_COMMUNITY)
Admission: RE | Admit: 2019-06-14 | Discharge: 2019-06-14 | Disposition: A | Discharge status: Home / Self Care | Payer: MEDICARE | Source: Ambulatory Visit | Attending: Surgery | Admitting: Surgery

## 2019-06-14 ENCOUNTER — Encounter (HOSPITAL_COMMUNITY): Payer: Self-pay

## 2019-06-14 ENCOUNTER — Other Ambulatory Visit: Payer: Self-pay

## 2019-06-14 ENCOUNTER — Encounter (HOSPITAL_COMMUNITY)
Admission: RE | Admit: 2019-06-14 | Discharge: 2019-06-14 | Disposition: A | Discharge status: Home / Self Care | Payer: MEDICARE | Source: Ambulatory Visit | Attending: Orthopaedic Surgery | Admitting: Orthopaedic Surgery

## 2019-06-14 DIAGNOSIS — M47812 Spondylosis without myelopathy or radiculopathy, cervical region: Secondary | ICD-10-CM | POA: Diagnosis not present

## 2019-06-14 DIAGNOSIS — Z20828 Contact with and (suspected) exposure to other viral communicable diseases: Secondary | ICD-10-CM | POA: Insufficient documentation

## 2019-06-14 DIAGNOSIS — G5602 Carpal tunnel syndrome, left upper limb: Secondary | ICD-10-CM | POA: Diagnosis not present

## 2019-06-14 DIAGNOSIS — R9431 Abnormal electrocardiogram [ECG] [EKG]: Secondary | ICD-10-CM | POA: Diagnosis not present

## 2019-06-14 DIAGNOSIS — Z01818 Encounter for other preprocedural examination: Secondary | ICD-10-CM | POA: Insufficient documentation

## 2019-06-14 DIAGNOSIS — M4802 Spinal stenosis, cervical region: Secondary | ICD-10-CM | POA: Insufficient documentation

## 2019-06-14 LAB — CBC
HCT: 42.5 % (ref 36.0–46.0)
Hemoglobin: 13.5 g/dL (ref 12.0–15.0)
MCH: 30.2 pg (ref 26.0–34.0)
MCHC: 31.8 g/dL (ref 30.0–36.0)
MCV: 95.1 fL (ref 80.0–100.0)
Platelets: 121 10*3/uL — ABNORMAL LOW (ref 150–400)
RBC: 4.47 MIL/uL (ref 3.87–5.11)
RDW: 12.8 % (ref 11.5–15.5)
WBC: 8.2 10*3/uL (ref 4.0–10.5)
nRBC: 0 % (ref 0.0–0.2)

## 2019-06-14 LAB — URINALYSIS, ROUTINE W REFLEX MICROSCOPIC
Bilirubin Urine: NEGATIVE
Glucose, UA: NEGATIVE mg/dL
Hgb urine dipstick: NEGATIVE
Ketones, ur: NEGATIVE mg/dL
Leukocytes,Ua: NEGATIVE
Nitrite: NEGATIVE
Protein, ur: NEGATIVE mg/dL
Specific Gravity, Urine: 1.004 — ABNORMAL LOW (ref 1.005–1.030)
pH: 7 (ref 5.0–8.0)

## 2019-06-14 LAB — COMPREHENSIVE METABOLIC PANEL
ALT: 13 U/L (ref 0–44)
AST: 18 U/L (ref 15–41)
Albumin: 4.1 g/dL (ref 3.5–5.0)
Alkaline Phosphatase: 67 U/L (ref 38–126)
Anion gap: 8 (ref 5–15)
BUN: 8 mg/dL (ref 8–23)
CO2: 26 mmol/L (ref 22–32)
Calcium: 9.3 mg/dL (ref 8.9–10.3)
Chloride: 105 mmol/L (ref 98–111)
Creatinine, Ser: 0.53 mg/dL (ref 0.44–1.00)
GFR calc Af Amer: >60 mL/min (ref >60)
GFR calc non Af Amer: >60 mL/min (ref >60)
Glucose, Bld: 102 mg/dL — ABNORMAL HIGH (ref 70–99)
Potassium: 3.5 mmol/L (ref 3.5–5.1)
Sodium: 139 mmol/L (ref 135–145)
Total Bilirubin: 0.7 mg/dL (ref 0.3–1.2)
Total Protein: 6.6 g/dL (ref 6.5–8.1)

## 2019-06-14 LAB — SURGICAL PCR SCREEN
MRSA, PCR: NEGATIVE
Staphylococcus aureus: NEGATIVE

## 2019-06-14 NOTE — Progress Notes (Signed)
[id number] - [place], [address] [address] Phone: [phone] Fax: [phone]      Your procedure is scheduled on June 17, 2019.  Report to [facility] Main Entrance "A" at 5:30 A.M., and check in at the Admitting office.  Call this number if you have problems the morning of surgery:  [phone]  Call [phone] if you have any questions prior to your surgery date Monday-Friday 8am-4pm    Remember:  Do not eat  after midnight the night before your surgery  You may drink clear liquids until 4:30 A.M. the morning of your surgery.   Clear liquids allowed are: Water, Non-Citrus Juices (without pulp), Carbonated Beverages, Clear Tea, Black Coffee Only, and Gatorade  Please complete your PRE-SURGERY ENSURE that was provided to you by 4:30 A.M. the morning of surgery.  Please, if able, drink it in one setting. DO NOT SIP.    Take these medicines the morning of surgery with A SIP OF WATER: busPIRone (BUSPAR)  As of today, STOP taking any Aspirin (unless otherwise instructed by your surgeon), Aleve, Naproxen, Ibuprofen, Motrin, Advil, Goody's, BC's, all herbal medications, fish oil, and all vitamins.    The Morning of Surgery  Do not wear jewelry, make-up or nail polish.  Do not wear lotions, powders, perfumes or deodorant  Do not shave 48 hours prior to surgery.    Do not bring valuables to the hospital.  [facility] is not responsible for any belongings or valuables.  If you are a smoker, DO NOT Smoke 24 hours prior to surgery IF you wear a CPAP at night please bring your mask, tubing, and machine the morning of surgery   Remember that you must have someone to transport you home after your surgery, and remain with you for 24 hours if you are discharged the same day.   Contacts, glasses, hearing aids, dentures or bridgework may not be worn into surgery.    Leave your suitcase in the car.  After surgery it may be  brought to your room.  For patients admitted to the hospital, discharge time will be determined by your treatment team.  Patients discharged the day of surgery will not be allowed to drive home.    Special instructions:   [place]- Preparing For Surgery  Before surgery, you can play an important role. Because skin is not sterile, your skin needs to be as free of germs as possible. You can reduce the number of germs on your skin by washing with CHG (chlorahexidine gluconate) Soap before surgery.  CHG is an antiseptic cleaner which kills germs and bonds with the skin to continue killing germs even after washing.    Oral Hygiene is also important to reduce your risk of infection.  Remember - BRUSH YOUR TEETH THE MORNING OF SURGERY WITH YOUR REGULAR TOOTHPASTE  Please do not use if you have an allergy to CHG or antibacterial soaps. If your skin becomes reddened/irritated stop using the CHG.  Do not shave (including legs and underarms) for at least 48 hours prior to first CHG shower. It is OK to shave your face.  Please follow these instructions carefully.   1. Shower the NIGHT BEFORE SURGERY and the MORNING OF SURGERY with CHG Soap.   2. If you chose to wash your hair, wash your hair first as usual with your normal shampoo.  3. After you shampoo, rinse your hair and body thoroughly to remove  the shampoo.  4. Use CHG as you would any other liquid soap. You can apply CHG directly to the skin and wash gently with a scrungie or a clean washcloth.   5. Apply the CHG Soap to your body ONLY FROM THE NECK DOWN.  Do not use on open wounds or open sores. Avoid contact with your eyes, ears, mouth and genitals (private parts). Wash Face and genitals (private parts)  with your normal soap.   6. Wash thoroughly, paying special attention to the area where your surgery will be performed.  7. Thoroughly rinse your body with warm water from the neck down.  8. DO NOT shower/wash with your normal soap  after using and rinsing off the CHG Soap.  9. Pat yourself dry with a CLEAN TOWEL.  10. Wear CLEAN PAJAMAS to bed the night before surgery, wear comfortable clothes the morning of surgery  11. Place CLEAN SHEETS on your bed the night of your first shower and DO NOT SLEEP WITH PETS.    Day of Surgery:  Do not apply any deodorants/lotions. Please shower the morning of surgery with the CHG soap  Please wear clean clothes to the hospital/surgery center.   Remember to brush your teeth WITH YOUR REGULAR TOOTHPASTE.   Please read over the following fact sheets that you were given.

## 2019-06-14 NOTE — Progress Notes (Signed)
PCP:  Dr. Claretta Fraise Cardiologist:  denies  EKG:  06/14/19 CXR:  06/14/19 ECHO:  denies Stress Test:  denies Cardiac Cath:  Denies  Covid testing today 06/14/19  Patient denies shortness of breath, fever, cough, and chest pain at PAT appointment.  Patient verbalized understanding of instructions provided today at the PAT appointment.  Patient asked to review instructions at home and day of surgery.

## 2019-06-16 LAB — NOVEL CORONAVIRUS, NAA (HOSP ORDER, SEND-OUT TO REF LAB; TAT 18-24 HRS): SARS-CoV-2, NAA: NOT DETECTED

## 2019-06-16 NOTE — Anesthesia Preprocedure Evaluation (Addendum)
Anesthesia Evaluation  Patient identified by MRN, date of birth, ID band Patient awake    Reviewed: Allergy & Precautions, NPO status , Patient's Chart, lab work & pertinent test results  Airway Mallampati: I  TM Distance: >3 FB Neck ROM: Full    Dental  (+) Dental Advisory Given, Edentulous Upper, Edentulous Lower   Pulmonary neg pulmonary ROS, Current SmokerPatient did not abstain from smoking.,    Pulmonary exam normal        Cardiovascular negative cardio ROS Normal cardiovascular exam     Neuro/Psych PSYCHIATRIC DISORDERS Anxiety Depression    GI/Hepatic Neg liver ROS, GERD  ,  Endo/Other  negative endocrine ROS  Renal/GU negative Renal ROS     Musculoskeletal negative musculoskeletal ROS (+)   Abdominal   Peds  Hematology negative hematology ROS (+)   Anesthesia Other Findings Day of surgery medications reviewed with the patient.  Reproductive/Obstetrics                            Anesthesia Physical Anesthesia Plan  ASA: III  Anesthesia Plan: General   Post-op Pain Management:    Induction: Intravenous  PONV Risk Score and Plan: 3 and Ondansetron, Dexamethasone and Diphenhydramine  Airway Management Planned: Oral ETT  Additional Equipment:   Intra-op Plan:   Post-operative Plan: Extubation in OR  Informed Consent: I have reviewed the patients History and Physical, chart, labs and discussed the procedure including the risks, benefits and alternatives for the proposed anesthesia with the patient or authorized representative who has indicated his/her understanding and acceptance.     Dental advisory given  Plan Discussed with: Anesthesiologist and CRNA  Anesthesia Plan Comments:        Anesthesia Quick Evaluation

## 2019-06-17 ENCOUNTER — Ambulatory Visit (HOSPITAL_COMMUNITY): Payer: MEDICARE | Admitting: Certified Registered"

## 2019-06-17 ENCOUNTER — Encounter (HOSPITAL_COMMUNITY): Payer: Self-pay

## 2019-06-17 ENCOUNTER — Other Ambulatory Visit: Payer: Self-pay

## 2019-06-17 ENCOUNTER — Ambulatory Visit (HOSPITAL_COMMUNITY): Payer: MEDICARE

## 2019-06-17 ENCOUNTER — Observation Stay (HOSPITAL_COMMUNITY)
Admission: RE | Admit: 2019-06-17 | Discharge: 2019-06-18 | Disposition: A | Discharge status: Home Health | Payer: MEDICARE | Attending: Orthopaedic Surgery | Admitting: Orthopaedic Surgery

## 2019-06-17 ENCOUNTER — Encounter (HOSPITAL_COMMUNITY)
Admission: RE | Disposition: A | Discharge status: Home Health | Payer: Self-pay | Source: Home / Self Care | Attending: Orthopaedic Surgery

## 2019-06-17 ENCOUNTER — Other Ambulatory Visit (INDEPENDENT_AMBULATORY_CARE_PROVIDER_SITE_OTHER): Payer: Self-pay | Admitting: Surgery

## 2019-06-17 DIAGNOSIS — M47812 Spondylosis without myelopathy or radiculopathy, cervical region: Principal | ICD-10-CM | POA: Diagnosis present

## 2019-06-17 DIAGNOSIS — F329 Major depressive disorder, single episode, unspecified: Secondary | ICD-10-CM | POA: Insufficient documentation

## 2019-06-17 DIAGNOSIS — G5602 Carpal tunnel syndrome, left upper limb: Secondary | ICD-10-CM | POA: Diagnosis present

## 2019-06-17 DIAGNOSIS — M4322 Fusion of spine, cervical region: Secondary | ICD-10-CM | POA: Diagnosis not present

## 2019-06-17 DIAGNOSIS — F419 Anxiety disorder, unspecified: Secondary | ICD-10-CM | POA: Insufficient documentation

## 2019-06-17 DIAGNOSIS — Z79899 Other long term (current) drug therapy: Secondary | ICD-10-CM | POA: Insufficient documentation

## 2019-06-17 DIAGNOSIS — M9981 Other biomechanical lesions of cervical region: Secondary | ICD-10-CM | POA: Diagnosis not present

## 2019-06-17 DIAGNOSIS — K219 Gastro-esophageal reflux disease without esophagitis: Secondary | ICD-10-CM | POA: Insufficient documentation

## 2019-06-17 DIAGNOSIS — F17210 Nicotine dependence, cigarettes, uncomplicated: Secondary | ICD-10-CM | POA: Insufficient documentation

## 2019-06-17 DIAGNOSIS — Z419 Encounter for procedure for purposes other than remedying health state, unspecified: Secondary | ICD-10-CM

## 2019-06-17 DIAGNOSIS — M4802 Spinal stenosis, cervical region: Secondary | ICD-10-CM | POA: Diagnosis not present

## 2019-06-17 DIAGNOSIS — F418 Other specified anxiety disorders: Secondary | ICD-10-CM | POA: Diagnosis not present

## 2019-06-17 HISTORY — PX: CARPAL TUNNEL RELEASE: SHX101

## 2019-06-17 HISTORY — PX: ANTERIOR CERVICAL DECOMP/DISCECTOMY FUSION: SHX1161

## 2019-06-17 IMAGING — RF DG C-ARM 1-60 MIN
1 series · 5 of 5 positions shown · non-contrast
Comparison: [DATE].

CLINICAL DATA: Status post surgical anterior fusion of C5-6 and
C6-7.

EXAM:
CERVICAL SPINE - 2-3 VIEW; DG C-ARM 1-60 MIN
FLUOROSCOPY TIME:  13 seconds.

[Series 1: run · 5 of 5 slices shown]
[im 1/5]
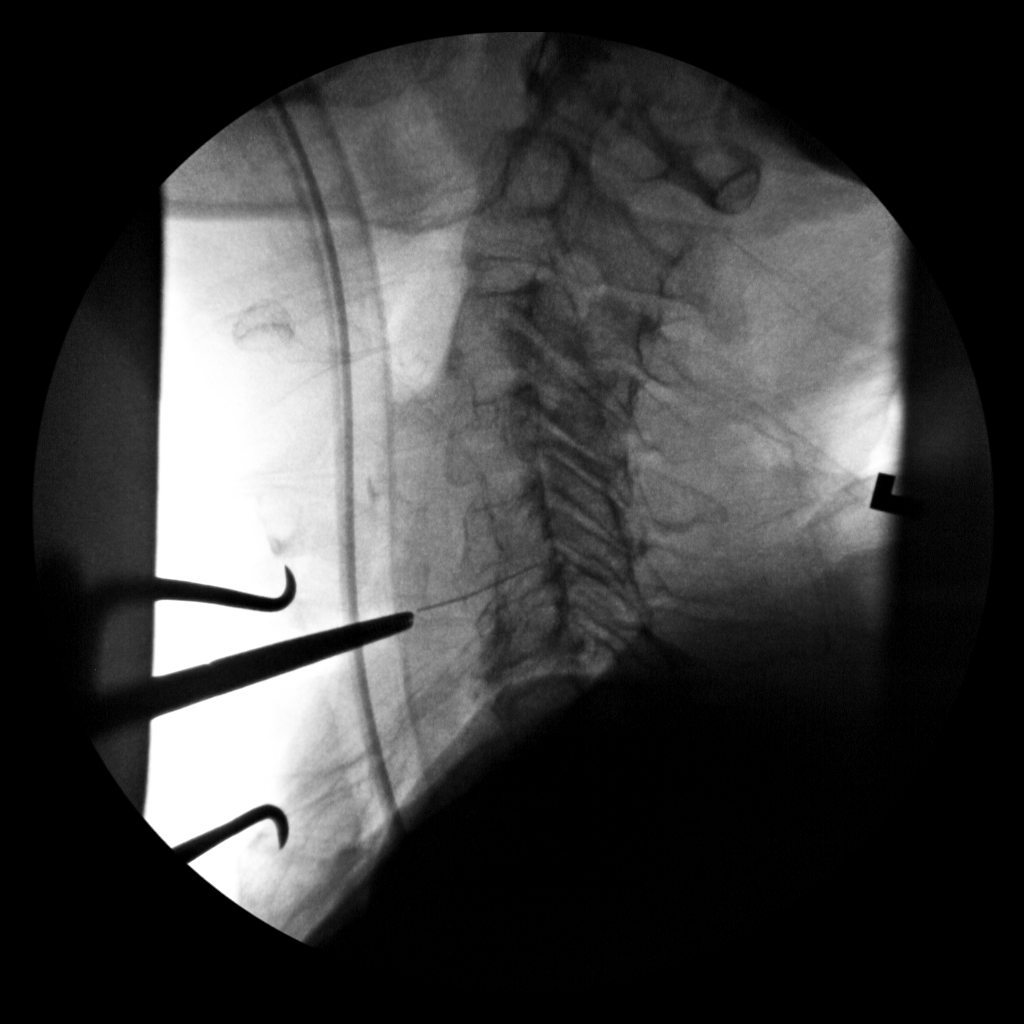
[im 2/5]
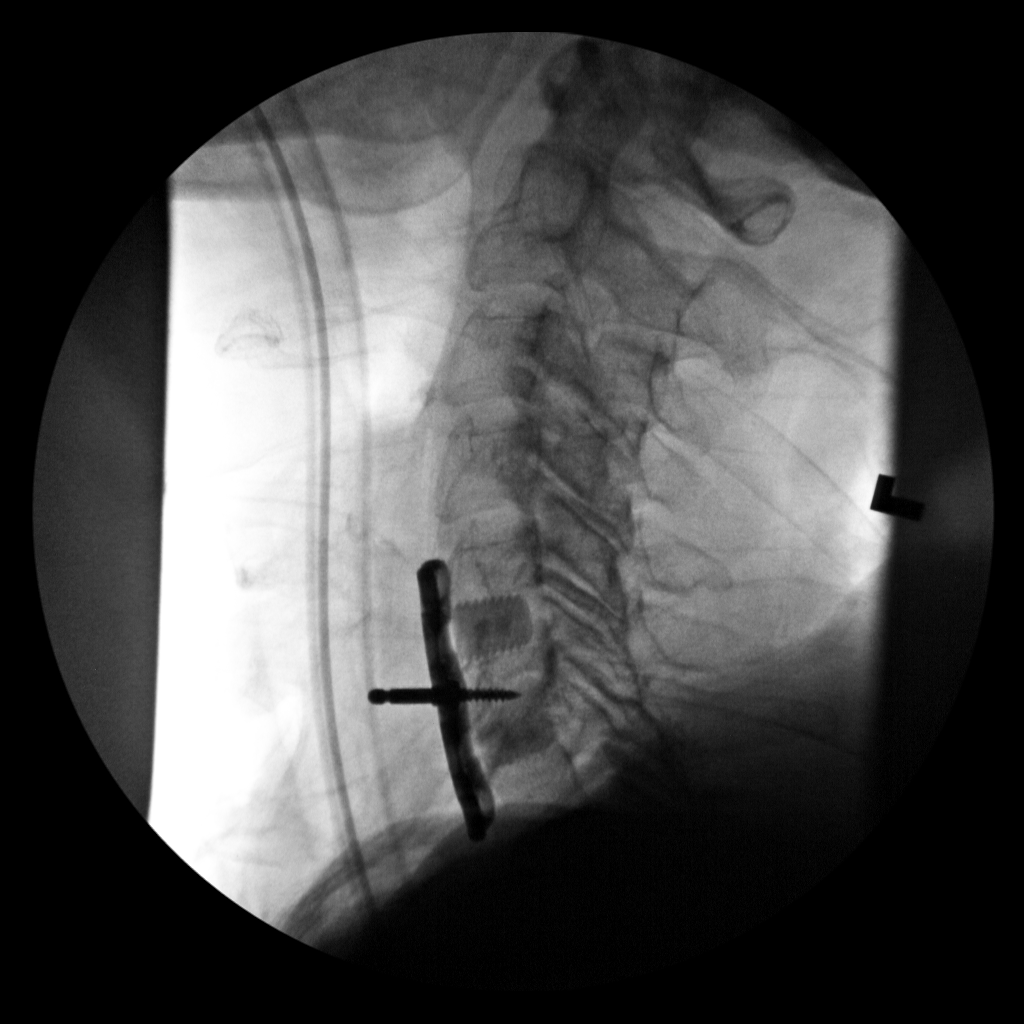
[im 3/5]
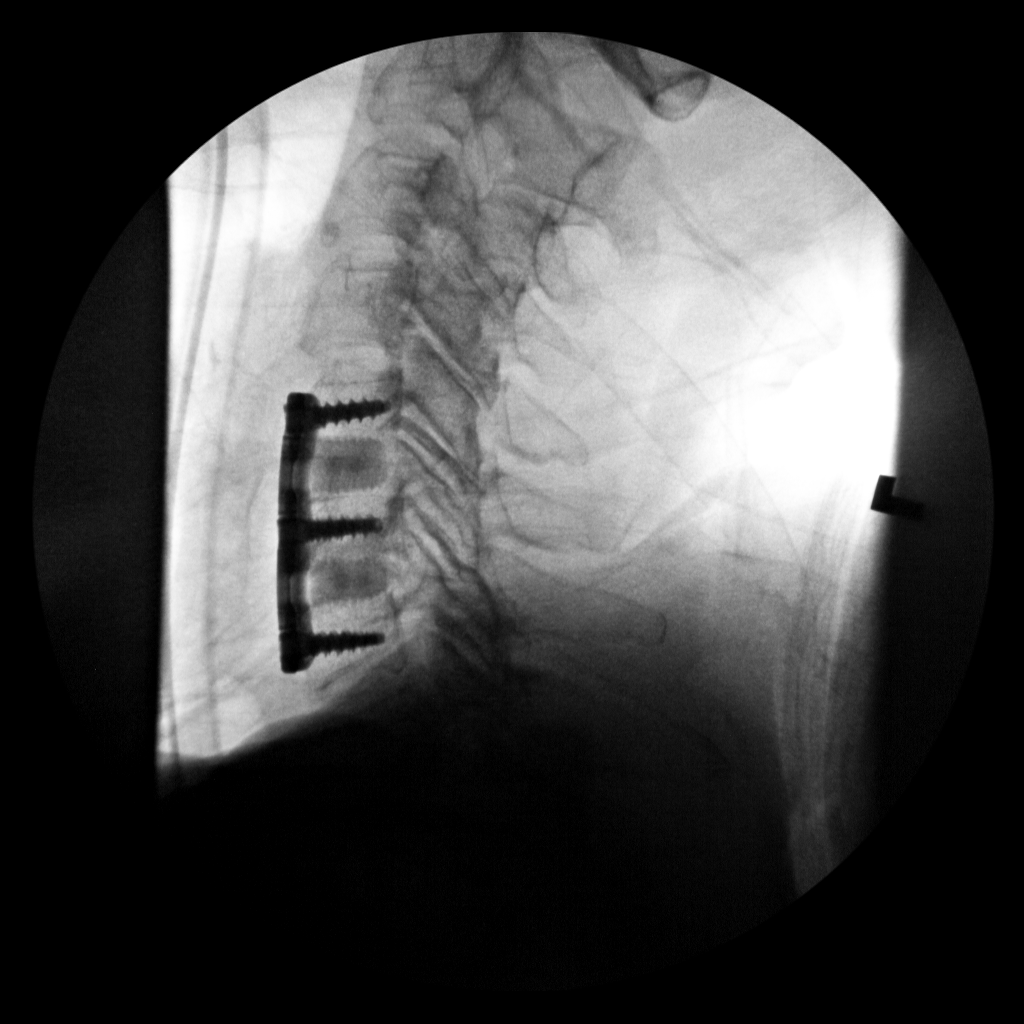
[im 4/5]
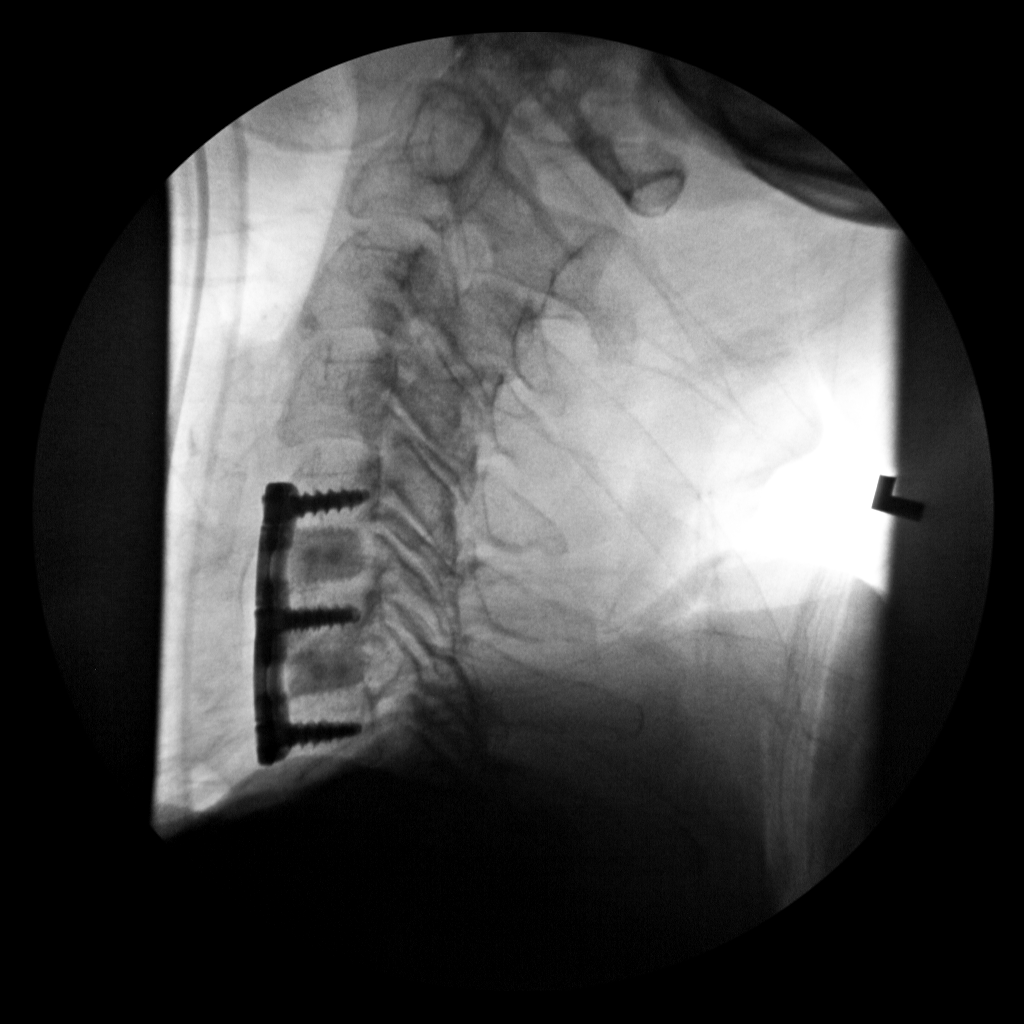
[im 5/5]
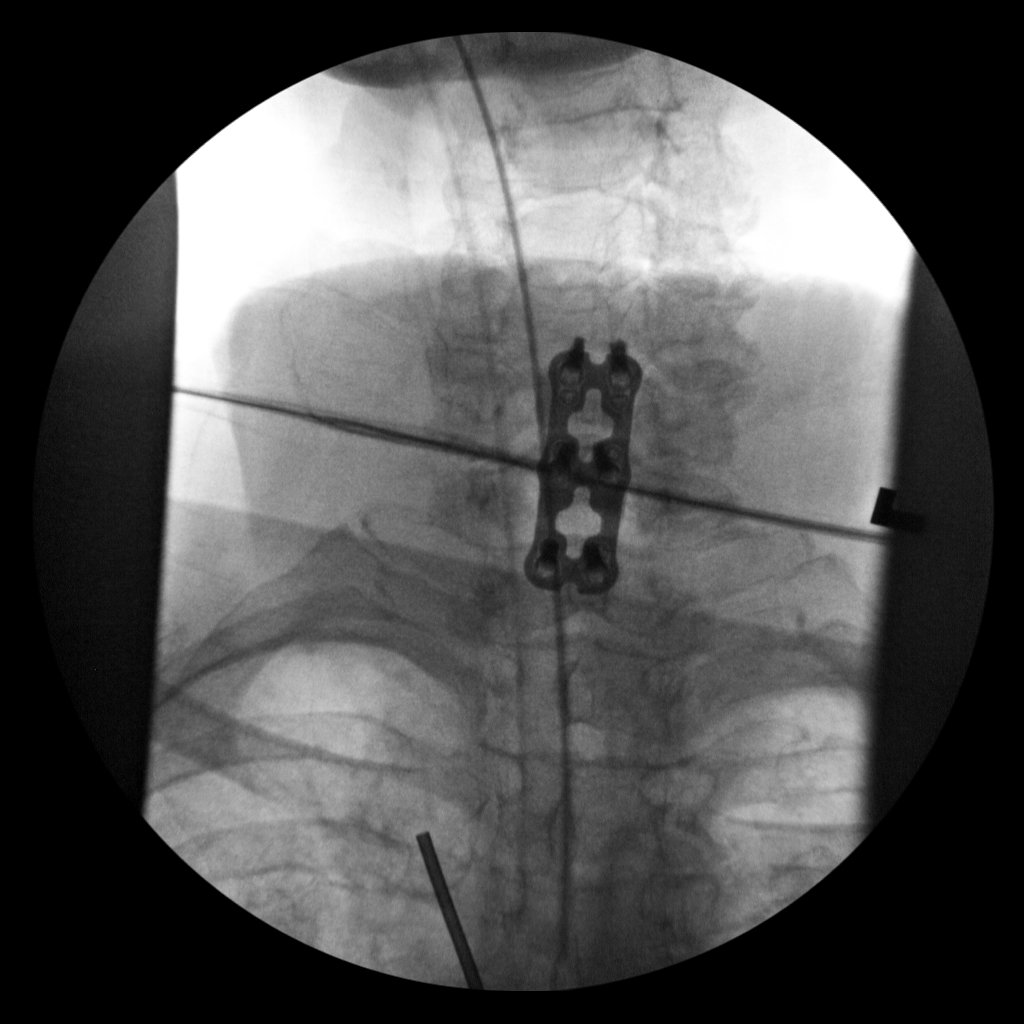

[5 of 5 positions shown; findings below may reference images not displayed]

FINDINGS: Five intraoperative fluoroscopic images were obtained of the
cervical spine. These images demonstrate surgical localization and
anterior fusion of the C5-6 and C6-7 disc spaces. Good alignment of
vertebral bodies is noted.
IMPRESSION: Fluoroscopic guidance provided during surgical anterior fusion of
C5-6 and C6-7.

## 2019-06-17 SURGERY — ANTERIOR CERVICAL DECOMPRESSION/DISCECTOMY FUSION 2 LEVELS
Anesthesia: General

## 2019-06-17 MED ORDER — LIDOCAINE 2% (20 MG/ML) 5 ML SYRINGE
INTRAMUSCULAR | Status: DC | PRN
  Administered 2019-06-17: 100 mg via INTRAVENOUS

## 2019-06-17 MED ORDER — ONDANSETRON HCL 4 MG/2ML IJ SOLN
INTRAMUSCULAR | Status: AC
  Filled 2019-06-17: qty 2

## 2019-06-17 MED ORDER — PHENYLEPHRINE HCL (PRESSORS) 10 MG/ML IV SOLN
INTRAVENOUS | Status: AC
  Filled 2019-06-17: qty 1

## 2019-06-17 MED ORDER — DEXAMETHASONE SODIUM PHOSPHATE 10 MG/ML IJ SOLN
INTRAMUSCULAR | Status: AC
  Filled 2019-06-17: qty 1

## 2019-06-17 MED ORDER — CITALOPRAM HYDROBROMIDE 20 MG PO TABS
20.0000 mg | ORAL_TABLET | Freq: Every day | ORAL | Status: DC
  Administered 2019-06-17: 20 mg via ORAL
  Filled 2019-06-17: qty 1

## 2019-06-17 MED ORDER — CELECOXIB 200 MG PO CAPS
ORAL_CAPSULE | ORAL | Status: AC
  Administered 2019-06-17: 07:00:00 400 mg via ORAL
  Filled 2019-06-17: qty 2

## 2019-06-17 MED ORDER — DOCUSATE SODIUM 100 MG PO CAPS
100.0000 mg | ORAL_CAPSULE | Freq: Two times a day (BID) | ORAL | Status: DC
  Administered 2019-06-17 – 2019-06-18 (×2): 100 mg via ORAL
  Filled 2019-06-17 (×2): qty 1

## 2019-06-17 MED ORDER — ACETAMINOPHEN 650 MG RE SUPP: 650.0000 mg | RECTAL | Status: DC | PRN

## 2019-06-17 MED ORDER — METHOCARBAMOL 500 MG PO TABS: 500.0000 mg | ORAL_TABLET | Freq: Four times a day (QID) | ORAL | 0 refills | Status: DC | PRN

## 2019-06-17 MED ORDER — ONDANSETRON HCL 4 MG PO TABS: 4.0000 mg | ORAL_TABLET | Freq: Four times a day (QID) | ORAL | Status: DC | PRN

## 2019-06-17 MED ORDER — CEFAZOLIN SODIUM-DEXTROSE 2-4 GM/100ML-% IV SOLN
INTRAVENOUS | Status: AC
  Filled 2019-06-17: qty 100

## 2019-06-17 MED ORDER — METHOCARBAMOL 1000 MG/10ML IJ SOLN
500.0000 mg | Freq: Four times a day (QID) | INTRAVENOUS | Status: DC | PRN
  Filled 2019-06-17: qty 5

## 2019-06-17 MED ORDER — 0.9 % SODIUM CHLORIDE (POUR BTL) OPTIME
TOPICAL | Status: DC | PRN
  Administered 2019-06-17: 1000 mL

## 2019-06-17 MED ORDER — ROCURONIUM BROMIDE 10 MG/ML (PF) SYRINGE
PREFILLED_SYRINGE | INTRAVENOUS | Status: DC | PRN
  Administered 2019-06-17: 70 mg via INTRAVENOUS
  Administered 2019-06-17: 20 mg via INTRAVENOUS

## 2019-06-17 MED ORDER — DIPHENHYDRAMINE HCL 50 MG/ML IJ SOLN
INTRAMUSCULAR | Status: DC | PRN
  Administered 2019-06-17: 12.5 mg via INTRAVENOUS

## 2019-06-17 MED ORDER — LIDOCAINE 2% (20 MG/ML) 5 ML SYRINGE
INTRAMUSCULAR | Status: AC
  Filled 2019-06-17: qty 5

## 2019-06-17 MED ORDER — ONDANSETRON HCL 4 MG/2ML IJ SOLN: 4.0000 mg | Freq: Four times a day (QID) | INTRAMUSCULAR | Status: DC | PRN

## 2019-06-17 MED ORDER — CHLORHEXIDINE GLUCONATE 4 % EX LIQD: 60.0000 mL | Freq: Once | CUTANEOUS | Status: DC

## 2019-06-17 MED ORDER — SODIUM CHLORIDE 0.9 % IV SOLN: INTRAVENOUS | Status: DC

## 2019-06-17 MED ORDER — HYDROMORPHONE HCL 1 MG/ML IJ SOLN: 0.5000 mg | INTRAMUSCULAR | Status: DC | PRN

## 2019-06-17 MED ORDER — LACTATED RINGERS IV SOLN
INTRAVENOUS | Status: DC | PRN
  Administered 2019-06-17 (×2): via INTRAVENOUS

## 2019-06-17 MED ORDER — FENTANYL CITRATE (PF) 100 MCG/2ML IJ SOLN
25.0000 ug | INTRAMUSCULAR | Status: DC | PRN
  Administered 2019-06-17 (×2): 50 ug via INTRAVENOUS

## 2019-06-17 MED ORDER — ACETAMINOPHEN 500 MG PO TABS
1000.0000 mg | ORAL_TABLET | Freq: Once | ORAL | Status: AC
  Administered 2019-06-17: 07:00:00 1000 mg via ORAL

## 2019-06-17 MED ORDER — EPHEDRINE SULFATE-NACL 50-0.9 MG/10ML-% IV SOSY
PREFILLED_SYRINGE | INTRAVENOUS | Status: DC | PRN
  Administered 2019-06-17: 10 mg via INTRAVENOUS

## 2019-06-17 MED ORDER — SODIUM CHLORIDE 0.9% FLUSH
3.0000 mL | Freq: Two times a day (BID) | INTRAVENOUS | Status: DC
  Administered 2019-06-17 (×2): 3 mL via INTRAVENOUS

## 2019-06-17 MED ORDER — BUPIVACAINE-EPINEPHRINE 0.5% -1:200000 IJ SOLN
INTRAMUSCULAR | Status: AC
  Filled 2019-06-17: qty 1

## 2019-06-17 MED ORDER — POLYETHYLENE GLYCOL 3350 17 G PO PACK
17.0000 g | PACK | Freq: Every day | ORAL | Status: DC
  Administered 2019-06-17 – 2019-06-18 (×2): 17 g via ORAL
  Filled 2019-06-17 (×2): qty 1

## 2019-06-17 MED ORDER — OXYCODONE HCL 5 MG PO TABS
5.0000 mg | ORAL_TABLET | ORAL | Status: DC | PRN
  Administered 2019-06-18: 08:00:00 5 mg via ORAL
  Filled 2019-06-17: qty 1

## 2019-06-17 MED ORDER — ONDANSETRON HCL 4 MG/2ML IJ SOLN
INTRAMUSCULAR | Status: DC | PRN
  Administered 2019-06-17: 4 mg via INTRAVENOUS

## 2019-06-17 MED ORDER — PROPOFOL 10 MG/ML IV BOLUS
INTRAVENOUS | Status: AC
  Filled 2019-06-17: qty 40

## 2019-06-17 MED ORDER — PHENYLEPHRINE HCL-NACL 10-0.9 MG/250ML-% IV SOLN
INTRAVENOUS | Status: DC | PRN
  Administered 2019-06-17: 5 ug/min via INTRAVENOUS

## 2019-06-17 MED ORDER — BUPIVACAINE-EPINEPHRINE 0.5% -1:200000 IJ SOLN
INTRAMUSCULAR | Status: DC | PRN
  Administered 2019-06-17: 6 mL

## 2019-06-17 MED ORDER — CEFAZOLIN SODIUM-DEXTROSE 2-4 GM/100ML-% IV SOLN
2.0000 g | INTRAVENOUS | Status: AC
  Administered 2019-06-17: 2 g via INTRAVENOUS

## 2019-06-17 MED ORDER — ACETAMINOPHEN 325 MG PO TABS
650.0000 mg | ORAL_TABLET | ORAL | Status: DC | PRN
  Administered 2019-06-18 (×2): 650 mg via ORAL
  Filled 2019-06-17 (×2): qty 2

## 2019-06-17 MED ORDER — SODIUM CHLORIDE 0.9% FLUSH: 3.0000 mL | INTRAVENOUS | Status: DC | PRN

## 2019-06-17 MED ORDER — DIPHENHYDRAMINE HCL 50 MG/ML IJ SOLN
INTRAMUSCULAR | Status: AC
  Filled 2019-06-17: qty 1

## 2019-06-17 MED ORDER — PROMETHAZINE HCL 25 MG/ML IJ SOLN: 6.2500 mg | INTRAMUSCULAR | Status: DC | PRN

## 2019-06-17 MED ORDER — DEXAMETHASONE SODIUM PHOSPHATE 10 MG/ML IJ SOLN
INTRAMUSCULAR | Status: DC | PRN
  Administered 2019-06-17: 8 mg via INTRAVENOUS

## 2019-06-17 MED ORDER — METHOCARBAMOL 500 MG PO TABS
500.0000 mg | ORAL_TABLET | Freq: Four times a day (QID) | ORAL | Status: DC | PRN
  Administered 2019-06-17 – 2019-06-18 (×2): 500 mg via ORAL
  Filled 2019-06-17 (×2): qty 1

## 2019-06-17 MED ORDER — FENTANYL CITRATE (PF) 100 MCG/2ML IJ SOLN
INTRAMUSCULAR | Status: DC | PRN
  Administered 2019-06-17: 100 ug via INTRAVENOUS
  Administered 2019-06-17: 50 ug via INTRAVENOUS

## 2019-06-17 MED ORDER — ROCURONIUM BROMIDE 10 MG/ML (PF) SYRINGE
PREFILLED_SYRINGE | INTRAVENOUS | Status: AC
  Filled 2019-06-17: qty 10

## 2019-06-17 MED ORDER — PROPOFOL 10 MG/ML IV BOLUS
INTRAVENOUS | Status: DC | PRN
  Administered 2019-06-17: 170 mg via INTRAVENOUS

## 2019-06-17 MED ORDER — OXYCODONE-ACETAMINOPHEN 5-325 MG PO TABS: 1.0000 | ORAL_TABLET | Freq: Four times a day (QID) | ORAL | 0 refills | Status: DC | PRN

## 2019-06-17 MED ORDER — MIDAZOLAM HCL 2 MG/2ML IJ SOLN
INTRAMUSCULAR | Status: AC
  Filled 2019-06-17: qty 2

## 2019-06-17 MED ORDER — CEFAZOLIN SODIUM-DEXTROSE 1-4 GM/50ML-% IV SOLN
1.0000 g | Freq: Three times a day (TID) | INTRAVENOUS | Status: AC
  Administered 2019-06-17 (×2): 1 g via INTRAVENOUS
  Filled 2019-06-17 (×2): qty 50

## 2019-06-17 MED ORDER — ACETAMINOPHEN 500 MG PO TABS
ORAL_TABLET | ORAL | Status: AC
  Administered 2019-06-17: 07:00:00 1000 mg via ORAL
  Filled 2019-06-17: qty 2

## 2019-06-17 MED ORDER — MIDAZOLAM HCL 2 MG/2ML IJ SOLN
INTRAMUSCULAR | Status: DC | PRN
  Administered 2019-06-17: 2 mg via INTRAVENOUS

## 2019-06-17 MED ORDER — PHENOL 1.4 % MT LIQD: 1.0000 | OROMUCOSAL | Status: DC | PRN

## 2019-06-17 MED ORDER — EPHEDRINE 5 MG/ML INJ
INTRAVENOUS | Status: AC
  Filled 2019-06-17: qty 10

## 2019-06-17 MED ORDER — CELECOXIB 200 MG PO CAPS
400.0000 mg | ORAL_CAPSULE | Freq: Once | ORAL | Status: AC
  Administered 2019-06-17: 07:00:00 400 mg via ORAL

## 2019-06-17 MED ORDER — SUGAMMADEX SODIUM 200 MG/2ML IV SOLN
INTRAVENOUS | Status: DC | PRN
  Administered 2019-06-17: 200 mg via INTRAVENOUS

## 2019-06-17 MED ORDER — FENTANYL CITRATE (PF) 250 MCG/5ML IJ SOLN
INTRAMUSCULAR | Status: AC
  Filled 2019-06-17: qty 5

## 2019-06-17 MED ORDER — MENTHOL 3 MG MT LOZG: 1.0000 | LOZENGE | OROMUCOSAL | Status: DC | PRN

## 2019-06-17 MED ORDER — BUSPIRONE HCL 10 MG PO TABS
10.0000 mg | ORAL_TABLET | Freq: Every day | ORAL | Status: DC
  Administered 2019-06-17: 21:00:00 10 mg via ORAL
  Filled 2019-06-17: qty 1

## 2019-06-17 MED ORDER — FENTANYL CITRATE (PF) 100 MCG/2ML IJ SOLN
INTRAMUSCULAR | Status: AC
  Filled 2019-06-17: qty 2

## 2019-06-17 SURGICAL SUPPLY — 77 items
BENZOIN TINCTURE PRP APPL 2/3 (GAUZE/BANDAGES/DRESSINGS) ×4 IMPLANT
BIT DRILL SKYLINE 12MM (BIT) ×2 IMPLANT
BLADE CLIPPER SURG (BLADE) IMPLANT
BNDG ELASTIC 4X5.8 VLCR STR LF (GAUZE/BANDAGES/DRESSINGS) ×4 IMPLANT
BONE CERV LORDOTIC 14.5X12X7 (Bone Implant) ×4 IMPLANT
BONE CERV LORDOTIC 14.5X12X9 (Bone Implant) ×4 IMPLANT
BUR ROUND FLUTED 4 SOFT TCH (BURR) ×3 IMPLANT
BUR ROUND FLUTED 4MM SOFT TCH (BURR) ×1
CLOSURE STERI-STRIP 1/2X4 (GAUZE/BANDAGES/DRESSINGS) ×1
CLOSURE WOUND 1/2 X4 (GAUZE/BANDAGES/DRESSINGS) ×1
CLSR STERI-STRIP ANTIMIC 1/2X4 (GAUZE/BANDAGES/DRESSINGS) ×3 IMPLANT
COLLAR CERV LO CONTOUR FIRM DE (SOFTGOODS) ×4 IMPLANT
CORD BIPOLAR FORCEPS 12FT (ELECTRODE) ×4 IMPLANT
COVER SURGICAL LIGHT HANDLE (MISCELLANEOUS) ×8 IMPLANT
COVER WAND RF STERILE (DRAPES) IMPLANT
CUFF TOURN SGL QUICK 18X4 (TOURNIQUET CUFF) ×4 IMPLANT
CUFF TOURN SGL QUICK 24 (TOURNIQUET CUFF)
CUFF TRNQT CYL 24X4X16.5-23 (TOURNIQUET CUFF) IMPLANT
DRAPE C-ARM 42X72 X-RAY (DRAPES) ×4 IMPLANT
DRAPE HALF SHEET 40X57 (DRAPES) ×4 IMPLANT
DRAPE MICROSCOPE LEICA (MISCELLANEOUS) ×4 IMPLANT
DRILL BIT SKYLINE 12MM (BIT) ×2
DRSG XEROFORM 1X8 (GAUZE/BANDAGES/DRESSINGS) ×4 IMPLANT
DURAPREP 26ML APPLICATOR (WOUND CARE) ×4 IMPLANT
DURAPREP 6ML APPLICATOR 50/CS (WOUND CARE) ×8 IMPLANT
ELECT COATED BLADE 2.86 ST (ELECTRODE) ×4 IMPLANT
ELECT REM PT RETURN 9FT ADLT (ELECTROSURGICAL) ×4
ELECTRODE REM PT RTRN 9FT ADLT (ELECTROSURGICAL) ×2 IMPLANT
EVACUATOR 1/8 PVC DRAIN (DRAIN) ×4 IMPLANT
GAUZE SPONGE 4X4 12PLY STRL (GAUZE/BANDAGES/DRESSINGS) ×4 IMPLANT
GAUZE SPONGE 4X4 12PLY STRL LF (GAUZE/BANDAGES/DRESSINGS) ×8 IMPLANT
GAUZE XEROFORM 1X8 LF (GAUZE/BANDAGES/DRESSINGS) ×4 IMPLANT
GLOVE BIOGEL PI IND STRL 8 (GLOVE) ×4 IMPLANT
GLOVE BIOGEL PI INDICATOR 8 (GLOVE) ×4
GLOVE ORTHO TXT STRL SZ7.5 (GLOVE) ×8 IMPLANT
GOWN STRL REUS W/ TWL LRG LVL3 (GOWN DISPOSABLE) ×4 IMPLANT
GOWN STRL REUS W/ TWL XL LVL3 (GOWN DISPOSABLE) ×4 IMPLANT
GOWN STRL REUS W/TWL 2XL LVL3 (GOWN DISPOSABLE) ×4 IMPLANT
GOWN STRL REUS W/TWL LRG LVL3 (GOWN DISPOSABLE) ×4
GOWN STRL REUS W/TWL XL LVL3 (GOWN DISPOSABLE) ×4
HALTER HD/CHIN CERV TRACTION D (MISCELLANEOUS) ×4 IMPLANT
HEMOSTAT SURGICEL 2X14 (HEMOSTASIS) IMPLANT
KIT BASIN OR (CUSTOM PROCEDURE TRAY) ×4 IMPLANT
KIT TURNOVER KIT B (KITS) ×4 IMPLANT
MANIFOLD NEPTUNE II (INSTRUMENTS) IMPLANT
NEEDLE 25GX 5/8IN NON SAFETY (NEEDLE) ×4 IMPLANT
NS IRRIG 1000ML POUR BTL (IV SOLUTION) ×4 IMPLANT
PACK ORTHO CERVICAL (CUSTOM PROCEDURE TRAY) ×4 IMPLANT
PACK ORTHO EXTREMITY (CUSTOM PROCEDURE TRAY) IMPLANT
PAD ARMBOARD 7.5X6 YLW CONV (MISCELLANEOUS) ×8 IMPLANT
PAD CAST 3X4 CTTN HI CHSV (CAST SUPPLIES) ×2 IMPLANT
PADDING CAST COTTON 3X4 STRL (CAST SUPPLIES) ×2
PATTIES SURGICAL .5 X.5 (GAUZE/BANDAGES/DRESSINGS) IMPLANT
PIN TEMP SKYLINE THREADED (PIN) ×4 IMPLANT
PLATE SKYLINE 2 LEVEL 34MM (Plate) ×4 IMPLANT
POSITIONER HEAD DONUT 9IN (MISCELLANEOUS) ×4 IMPLANT
RESTRAINT LIMB HOLDER UNIV (RESTRAINTS) IMPLANT
SCREW VARIABLE SELF TAP 12MM (Screw) ×24 IMPLANT
STRIP CLOSURE SKIN 1/2X4 (GAUZE/BANDAGES/DRESSINGS) ×3 IMPLANT
SUCTION FRAZIER HANDLE 10FR (MISCELLANEOUS)
SUCTION TUBE FRAZIER 10FR DISP (MISCELLANEOUS) IMPLANT
SURGIFLO W/THROMBIN 8M KIT (HEMOSTASIS) ×4 IMPLANT
SUT BONE WAX W31G (SUTURE) ×4 IMPLANT
SUT ETHILON 3 0 PS 1 (SUTURE) ×4 IMPLANT
SUT ETHILON 4 0 PS 2 18 (SUTURE) IMPLANT
SUT SILK 3 0 (SUTURE) ×2
SUT SILK 3-0 18XBRD TIE 12 (SUTURE) ×2 IMPLANT
SUT VIC AB 3-0 X1 27 (SUTURE) IMPLANT
SUT VICRYL 4-0 PS2 18IN ABS (SUTURE) ×4 IMPLANT
TAPE CLOTH SURG 4X10 WHT LF (GAUZE/BANDAGES/DRESSINGS) ×4 IMPLANT
TOWEL GREEN STERILE (TOWEL DISPOSABLE) ×4 IMPLANT
TOWEL GREEN STERILE FF (TOWEL DISPOSABLE) ×4 IMPLANT
TRAY FOLEY W/BAG SLVR 16FR (SET/KITS/TRAYS/PACK)
TRAY FOLEY W/BAG SLVR 16FR ST (SET/KITS/TRAYS/PACK) IMPLANT
TUBE CONNECTING 12'X1/4 (SUCTIONS)
TUBE CONNECTING 12X1/4 (SUCTIONS) IMPLANT
WATER STERILE IRR 1000ML POUR (IV SOLUTION) IMPLANT

## 2019-06-17 NOTE — Telephone Encounter (Signed)
OK .   One po tid prn spasms . ucall thanks

## 2019-06-17 NOTE — Op Note (Signed)
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  Left carpal tunnel syndrome.  Postop diagnosis: Same  Procedure: C5-6, C6-7 anterior cervical discectomy and fusion, allograft and plate.  Left carpal tunnel release.  Surgeon: Rodell Perna, MD  Assistant: Benjiman Core, PA-C medically necessary and present for the entire procedure  Anesthesia General plus Marcaine skin local 6 cc carpal tunnel and 6 cc skin cervical.  Drains: 1 Hemovac neck.  Procedure: After initial positioning for anterior cervical there was problems getting the monitor displayed MRI scan.  Hand was then placed on an arm board and carpal tunnel was performed first.  Proximal arm tourniquet was applied usual DuraPrep preoperative Ancef timeout procedure.  Extremity sheets and drapes were applied hand was wrapped with an Esmarch tourniquet inflated.  Tourniquet was up for 7 minutes was deflated at the end of the case and was at 250 pressure.  Incision was made following standard superficial landmarks with radial border of the ring finger and ulnar border of the thumb the distal exposed of the carpal canal which corresponded with the thenar crease.  Skin marker been used and incision was made.  Palmar fascia was divided.  Transverse carpal ligament was identified small bleeders were controlled with the bipolar cautery.  Transverse carpal ligament was divided along the ulnar aspect of the median nerve.  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  Complete release of the transverse carpal ligament fingertip was able be passed proximal to the distal wrist crease into the distal forearm with no areas compression and distally palmar fat and the superficial arch was visualized.  There was hourglass deformity of the median nerve.  Tourniquet was deflated hemostasis obtained with bipolar cautery irrigation and then standard skin closure with 3-0 nylon interrupted sutures postop dressing with tweezers  Xeroform 4 x 4's web roll and Ace wrap was applied.  Arm was then tucked again at the side wrist restraint was applied over the top the Ace wrap and had ultra traction been applied to the neck.  DuraPrep of the neck was performed timeout procedure repeated for the neck.  Area squared with towels Betadine Steri-Drape sterile female standard the head thyroid sheets and drapes.  Incision was made at the midline extending to the left.  Platysma was divided in line with skin incision.  Prominent vein was ligated with 3-0 silk suture using a right angle clamp.  Blunt dissection down to the longus Coley with spurs at C5-6 C6-7 noted.  Short 25 needle placed in C5-6 confirmation of appropriate level with visualization up to the skull counting down.  Disc portion was excised removed with a pituitary marking it and then self-retaining retractors were placed.  Anterior spurs removed with the bur.  Discectomy was performed progressing back to posterior longitudinal ligament using the operative microscope.  Spurs were removed particular attention was placed to the left side where there is severe foraminal stenosis and no extruded fragments of disc was present.  Overhanging posterior spurs removed for full decompression.  Some Surgi-Flo was used trial sizers and selection of a 9 mm graft was performed.  CRNA pulled traction graft was countersunk 2 mm and secure.  Identical procedure was repeated at the C6-7 level.  At this level there was only a 2 to 3 mm disc space with prominent spurs anteriorly and posteriorly.  Spurs were removed decompressing the dura again using the operative microscope.  1 and 2 mm Kerrisons were used some Surgi-Flo was used in the depth.  This was removed irrigated epidural space was dry and trial sizing showed a 7 mm graft gave good fit which was marked anteriorly inserted with the graft holder countersunk 2 mm and was stable.  34 mm plate was selected Synthes VueLock.  12 mm screws were placed x4  checking with sterilely draped C arm.  There was good position of graft and screws AP and lateral.  Screws were locked in with a tiny screwdriver Hemovac placement and out technique on the left in line with the skin incision platysma closed with 3-0 Vicryl 4-0 Vicryl subcuticular closure tincture benzoin Steri-Strips 4 x 4's tape and soft cervical collar.  Patient was transferred recovery room in stable condition.

## 2019-06-17 NOTE — Telephone Encounter (Signed)
Can you please advise. It looks like this was sent from the pharmacy due to methocarbamol not being on the formulary.

## 2019-06-17 NOTE — Interval H&P Note (Signed)
History and Physical Interval Note:  06/17/2019 7:15 AM  Carrie Villa  has presented today for surgery, with the diagnosis of cervical spondylosis, foraminal stenosis, left carpal tunnel release.  The various methods of treatment have been discussed with the patient and family. After consideration of risks, benefits and other options for treatment, the patient has consented to  Procedure(s): C5-6, C6-7  CERVICAL DECOMPRESSION/DISCECTOMY FUSION, ALLOGRAFT, PLATE (N/A) LEFT CARPAL TUNNEL RELEASE (Left) as a surgical intervention.  The patient's history has been reviewed, patient examined, no change in status, stable for surgery.  I have reviewed the patient's chart and labs.  Questions were answered to the patient's satisfaction.     Marybelle Killings

## 2019-06-17 NOTE — Anesthesia Procedure Notes (Signed)
Procedure Name: Intubation Date/Time: 06/17/2019 7:36 AM Performed by: Leonor Liv, CRNA Pre-anesthesia Checklist: Patient identified, Emergency Drugs available, Suction available and Patient being monitored Patient Re-evaluated:Patient Re-evaluated prior to induction Oxygen Delivery Method: Circle System Utilized Preoxygenation: Pre-oxygenation with 100% oxygen Induction Type: IV induction Ventilation: Mask ventilation without difficulty Laryngoscope Size: Mac and 3 Grade View: Grade I Tube type: Oral Tube size: 7.0 mm Number of attempts: 1 Airway Equipment and Method: Stylet Placement Confirmation: ETT inserted through vocal cords under direct vision,  positive ETCO2 and breath sounds checked- equal and bilateral Secured at: 21 cm Tube secured with: Tape Dental Injury: Teeth and Oropharynx as per pre-operative assessment

## 2019-06-17 NOTE — Telephone Encounter (Signed)
Sent to pharmacy 

## 2019-06-17 NOTE — Anesthesia Postprocedure Evaluation (Signed)
Anesthesia Post Note  Patient: Davisha Gallego  Procedure(s) Performed: C5-6, C6-7  CERVICAL DECOMPRESSION/DISCECTOMY FUSION, ALLOGRAFT, PLATE (N/A ) LEFT CARPAL TUNNEL RELEASE (Left )     Patient location during evaluation: PACU Anesthesia Type: General Level of consciousness: sedated Pain management: pain level controlled Vital Signs Assessment: post-procedure vital signs reviewed and stable Respiratory status: spontaneous breathing and respiratory function stable Cardiovascular status: stable Postop Assessment: no apparent nausea or vomiting Anesthetic complications: no    Last Vitals:  Vitals:   06/17/19 1119 06/17/19 1153  BP: 114/64 128/70  Pulse: 62 70  Resp: (!) 7 18  Temp:  36.6 C  SpO2: 94% 93%    Last Pain:  Vitals:   06/17/19 1200  TempSrc:   PainSc: 3                  Bristol Soy DANIEL

## 2019-06-17 NOTE — Progress Notes (Signed)
Orthopedic Tech Progress Note Patient Details:  Carrie Villa Mar 07, 1946 [id number]  Ortho Devices Type of Ortho Device: Soft collar Ortho Device/Splint Location: bedside for showers Ortho Device/Splint Interventions: Adjustment   Post Interventions Patient Tolerated: Well Instructions Provided: Care of device   Braulio Bosch 06/17/2019, 12:38 PM

## 2019-06-17 NOTE — Transfer of Care (Signed)
Immediate Anesthesia Transfer of Care Note  Patient: Carrie Villa  Procedure(s) Performed: C5-6, C6-7  CERVICAL DECOMPRESSION/DISCECTOMY FUSION, ALLOGRAFT, PLATE (N/A ) LEFT CARPAL TUNNEL RELEASE (Left )  Patient Location: PACU  Anesthesia Type:General  Level of Consciousness: drowsy and patient cooperative  Airway & Oxygen Therapy: Patient Spontanous Breathing and Patient connected to nasal cannula oxygen  Post-op Assessment: Report given to RN, Post -op Vital signs reviewed and stable and Patient moving all extremities  Post vital signs: Reviewed and stable  Last Vitals:  Vitals Value Taken Time  BP 132/70 06/17/19 1019  Temp 36.5 C 06/17/19 1019  Pulse 71 06/17/19 1029  Resp 16 06/17/19 1029  SpO2 98 % 06/17/19 1029  Vitals shown include unvalidated device data.  Last Pain:  Vitals:   06/17/19 1019  TempSrc:   PainSc: Asleep      Patients Stated Pain Goal: 3 ([id number])  Complications: No apparent anesthesia complications

## 2019-06-17 NOTE — H&P (Signed)
Carrie Villa is an 73 y.o. female.   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 HPI: 72 year old female with 4-year history of progressive neck pain and left carpal tunnel with positive electrical studies.  She has been through anti-inflammatories, traction, prednisone Dosepak.  Operative treatment was delayed due to Covid.  She has had splinting for wrist with persistent symptoms of left hand numbness pain wakes her up at night despite conservative treatment.  MRI scan shows cervical spondylosis C5-6, C6-7 with cord flattening and severe left foraminal stenosis.  Past Medical History:  Diagnosis Date  . Anxiety   . Arthritis   . Cancer ([place])    Cervix  . Carpal tunnel syndrome    left  . Complication of anesthesia    felt clipping during colonoscopy  . Depression   . GERD (gastroesophageal reflux disease)   . Goiter   . Heart murmur   . History of blood transfusion     Past Surgical History:  Procedure Laterality Date  . ABDOMINAL HYSTERECTOMY     partial   . COLONOSCOPY W/ POLYPECTOMY     x 4  . SHOULDER SURGERY Left    patient not sure what was done, "I had a small incision."    Family History  Problem Relation Age of Onset  . Cancer Mother    Social History:  reports that she has been smoking cigarettes. She has a 45.75 pack-year smoking history. She has never used smokeless tobacco. She reports current alcohol use. She reports that she does not use drugs.  Allergies:  Allergies  Allergen Reactions  . Pravastatin     Foot pain at 20mg     Medications Prior to Admission  Medication Sig Dispense Refill  . Ascorbic Acid (VITAMIN C) 1000 MG tablet Take 1,000 mg by mouth daily.    . busPIRone (BUSPAR) 10 MG tablet Take 1 tablet (10 mg total) by mouth 2 (two) times daily. (Patient taking differently: Take 10 mg by mouth at bedtime. ) 180 tablet 1  . cholecalciferol (VITAMIN D3) 25 MCG (1000 UT) tablet Take  1,000 Units by mouth daily.    . citalopram (CELEXA) 20 MG tablet Take 1 tablet (20 mg total) by mouth daily. Must be seen (Patient taking differently: Take 20 mg by mouth at bedtime. ) 90 tablet 1  . pantoprazole (PROTONIX) 40 MG tablet Take 1 tablet (40 mg total) by mouth daily. (Patient not taking: Reported on 06/06/2019) 90 tablet 1    No results found for this or any previous visit (from the past 48 hour(s)). No results found.  ROS positive for GERD, 1 pack/day smoker long-term.  History of depression on Celexa.  Low back pain.  Cervical spondylosis with radiculopathy and also carpal tunnel syndrome left.  Blood pressure (!) 131/54, pulse (!) 57, temperature 98 F (36.7 C), temperature source Oral, resp. rate 20, height 5' 5.5" (1.664 m), weight 61.9 kg, SpO2 98 %. Physical Exam  Constitutional: She is oriented to person, place, and time. She appears well-developed and well-nourished.  HENT:  Head: Normocephalic.  Eyes: Pupils are equal, round, and reactive to light.  Neck: No tracheal deviation present. No thyromegaly present.  Cardiovascular: Normal rate and regular rhythm.  Respiratory: Effort normal.  GI: Soft.  Musculoskeletal: Normal range of motion.        General: No tenderness or edema.  Neurological: She is alert and oriented to person, place, and time.  Skin: Skin is warm.  Psychiatric: She has a normal mood and affect. Her behavior is normal. Judgment and thought content normal.  Positive Spurling left greater than right neck.  Positive brachial plexus tenderness on the left.  Mild thenar atrophy on the left positive Phalen's test positive carpal compression test.  Assessment/Plan Plan cervical fusion C5-6, C6-7 with allograft and plate.  Left carpal tunnel release.  Plan procedure discussed with patient risk surgery discussed.  Questions were elicited and answered she understands posterior proceed.  Marybelle Killings, MD 06/17/2019, 7:10 AM

## 2019-06-18 DIAGNOSIS — M4802 Spinal stenosis, cervical region: Secondary | ICD-10-CM | POA: Diagnosis not present

## 2019-06-18 DIAGNOSIS — G5602 Carpal tunnel syndrome, left upper limb: Secondary | ICD-10-CM | POA: Diagnosis not present

## 2019-06-18 DIAGNOSIS — K219 Gastro-esophageal reflux disease without esophagitis: Secondary | ICD-10-CM | POA: Diagnosis not present

## 2019-06-18 DIAGNOSIS — M47812 Spondylosis without myelopathy or radiculopathy, cervical region: Secondary | ICD-10-CM | POA: Diagnosis not present

## 2019-06-18 DIAGNOSIS — F329 Major depressive disorder, single episode, unspecified: Secondary | ICD-10-CM | POA: Diagnosis not present

## 2019-06-18 DIAGNOSIS — F419 Anxiety disorder, unspecified: Secondary | ICD-10-CM | POA: Diagnosis not present

## 2019-06-18 MED ORDER — CYCLOBENZAPRINE HCL 5 MG PO TABS: 5.0000 mg | ORAL_TABLET | Freq: Three times a day (TID) | ORAL | 1 refills | Status: DC | PRN

## 2019-06-18 NOTE — Progress Notes (Signed)
Physical Therapy Evaluation Patient Details Name: Carrie Villa MRN: [id number] DOB: [birth year] Today's Date: 06/18/2019   History of Present Illness  Pt is a 73 yo female s/p Cervical decompression C5-6 C6-7 with L carpal tunnel release [id number], arthritis CA depression Goiter heart Murmur L shoulder surgery      Clinical Impression  Pt presented supine in bed with HOB elevated, awake and willing to participate in therapy session. Prior to admission, pt reported that she was independent with all functional mobility and ADLs. Pt lives with her boyfriend in a single level home with several steps to enter. She will have 24/7 supervision/assistance upon d/c. At the time of evaluation, pt at mod I to independent with all functional mobility including stair navigation. PT provided pt with cervical precautions handout and reviewed throughout. PT provided pt education re: cervical precautions, car transfers and generalized walking program for pt to initiate upon d/c home. No further acute PT needs identified at this time. PT signing off.     Follow Up Recommendations No PT follow up    Equipment Recommendations  None recommended by PT    Recommendations for Other Services       Precautions / Restrictions Precautions Precautions: Cervical Precaution Booklet Issued: Yes (comment) Precaution Comments: provided pt with cervical handout and reviewed with pt throughout Required Braces or Orthoses: Cervical Brace Cervical Brace: Soft collar;At all times Restrictions Weight Bearing Restrictions: No      Mobility  Bed Mobility Overal bed mobility: Independent                Transfers Overall transfer level: Independent                  Ambulation/Gait Ambulation/Gait assistance: Modified independent (Device/Increase time) Gait Distance (Feet): 500 Feet Assistive device: None Gait Pattern/deviations: WFL(Within Functional Limits)     General Gait Details: no instability  or LOB  Stairs Stairs: Yes Stairs assistance: Modified independent (Device/Increase time) Stair Management: One rail Right;Step to pattern;Forwards Number of Stairs: 10    Wheelchair Mobility    Modified Rankin (Stroke Patients Only)       Balance Overall balance assessment: No apparent balance deficits (not formally assessed)                                           Pertinent Vitals/Pain Pain Assessment: No/denies pain    Home Living Family/patient expects to be discharged to:: Private residence Living Arrangements: Spouse/significant other Available Help at Discharge: Family;Friend(s);Available PRN/intermittently Type of Home: House Home Access: Stairs to enter Entrance Stairs-Rails: Right Entrance Stairs-Number of Steps: 6 Home Layout: One level Home Equipment: Grab bars - toilet;Cane - single point Additional Comments: has 2 dogs, a friend staying for 1 full week     Prior Function Level of Independence: Independent               Hand Dominance   Dominant Hand: Right    Extremity/Trunk Assessment   Upper Extremity Assessment Upper Extremity Assessment: Defer to OT evaluation LUE Deficits / Details: mcp to forearm cast educated on avoiding weight bearing and to keep dry, complete numbness in 3rd 4 th 5th digits. reports sensation present in 1st and 5th digit but not normal sensation    Lower Extremity Assessment Lower Extremity Assessment: Overall WFL for tasks assessed    Cervical / Trunk Assessment Cervical / Trunk Assessment: Other  exceptions Cervical / Trunk Exceptions: s/p cervical sx  Communication   Communication: No difficulties  Cognition Arousal/Alertness: Awake/alert Behavior During Therapy: WFL for tasks assessed/performed Overall Cognitive Status: Within Functional Limits for tasks assessed                                 General Comments: w      General Comments General comments (skin  integrity, edema, etc.): dressing dry and bandage intact    Exercises     Assessment/Plan    PT Assessment Patent does not need any further PT services  PT Problem List         PT Treatment Interventions      PT Goals (Current goals can be found in the Care Plan section)  Acute Rehab PT Goals Patient Stated Goal: "home today" PT Goal Formulation: All assessment and education complete, DC therapy    Frequency     Barriers to discharge        Co-evaluation               AM-PAC PT "6 Clicks" Mobility  Outcome Measure Help needed turning from your back to your side while in a flat bed without using bedrails?: None Help needed moving from lying on your back to sitting on the side of a flat bed without using bedrails?: None Help needed moving to and from a bed to a chair (including a wheelchair)?: None Help needed standing up from a chair using your arms (e.g., wheelchair or bedside chair)?: None Help needed to walk in hospital room?: None Help needed climbing 3-5 steps with a railing? : None 6 Click Score: 24    End of Session Equipment Utilized During Treatment: Cervical collar Activity Tolerance: Patient tolerated treatment well Patient left: in bed;with call bell/phone within reach Nurse Communication: Mobility status PT Visit Diagnosis: Other abnormalities of gait and mobility (R26.89)    [id number] PT Time Calculation (min) (ACUTE ONLY): 10 min   Charges:   PT Evaluation $PT Eval Low Complexity: 1 Low          Eduard Clos, PT, DPT  Acute Rehabilitation Services Pager [phone] Office [place] 06/18/2019, 10:51 AM

## 2019-06-18 NOTE — Progress Notes (Signed)
Discharged instructions/education/AVS/Rx given to patient and she verbalized understanding. Dressing CDI, no drainage, no swelling, no redness noted on incision site. Moving L hand and fingers well. Ambulating well independently. Voiding and emptying bladder well. Swallowing well with no complaints. Pain is mild to moderate and controlled by PRN meds per patient. Awaiting for transport.

## 2019-06-18 NOTE — Evaluation (Signed)
Occupational Therapy Evaluation Patient Details Name: Carrie Villa MRN: [id number] DOB: 06/06/1946 Today's Date: 06/18/2019    History of Present Illness 73 yo female s/p Cervical decompression C5-6 C6-7 with L carpal tunnel release [id number], arthritis CA depression Goiter heart Murmur L shoulder surgery     Clinical Impression   Patient evaluated by Occupational Therapy with no further acute OT needs identified. All education has been completed and the patient has no further questions. See below for any follow-up Occupational Therapy or equipment needs. OT to sign off. Thank you for referral.      Follow Up Recommendations  No OT follow up    Equipment Recommendations  None recommended by OT    Recommendations for Other Services       Precautions / Restrictions Precautions Precautions: Cervical Precaution Comments: handout provided and reviewed to adls Required Braces or Orthoses: Cervical Brace Cervical Brace: Soft collar;At all times      Mobility Bed Mobility Overal bed mobility: Independent                Transfers Overall transfer level: Independent                    Balance                                           ADL either performed or assessed with clinical judgement   ADL Overall ADL's : Modified independent                                       General ADL Comments: educated on changing of brace, don doff brace, positioning of brace, provided extra stockingnette for the brace to keep good hygiene., educated on wrapping second brace to keep it dry and clean. Pt reports difficutly with button shirt but was able to complete task     Vision Baseline Vision/History: Wears glasses Wears Glasses: At all times       Perception     Praxis      Pertinent Vitals/Pain Pain Assessment: No/denies pain     Hand Dominance Right   Extremity/Trunk Assessment Upper Extremity Assessment Upper Extremity  Assessment: LUE deficits/detail LUE Deficits / Details: mcp to forearm cast educated on avoiding weight bearing and to keep dry, complete numbness in 3rd 4 th 5th digits. reports sensation present in 1st and 5th digit but not normal sensation   Lower Extremity Assessment Lower Extremity Assessment: Defer to PT evaluation   Cervical / Trunk Assessment Cervical / Trunk Assessment: Other exceptions Cervical / Trunk Exceptions: s/p surg   Communication Communication Communication: No difficulties   Cognition Arousal/Alertness: Awake/alert Behavior During Therapy: WFL for tasks assessed/performed Overall Cognitive Status: Within Functional Limits for tasks assessed                                 General Comments: w   General Comments  dressing dry and bandage intact    Exercises     Shoulder Instructions      Home Living Family/patient expects to be discharged to:: Private residence Living Arrangements: Spouse/significant other Available Help at Discharge: Family;Friend(s);Available PRN/intermittently Type of Home: House Home Access: Stairs to enter [employer] of Steps: 6 Entrance Stairs-Rails:  Right Home Layout: One level     Bathroom Shower/Tub: Teacher, early years/pre: Standard     Home Equipment: Grab bars - toilet;Cane - single point   Additional Comments: has 2 dogs, a friend staying for 1 full week       Prior Functioning/Environment Level of Independence: Independent                 OT Problem List:        OT Treatment/Interventions:      OT Goals(Current goals can be found in the care plan section) Acute Rehab OT Goals Patient Stated Goal: to go home   OT Frequency:     Barriers to D/C:            Co-evaluation              AM-PAC OT "6 Clicks" Daily Activity     Outcome Measure Help from another person eating meals?: None Help from another person taking care of personal grooming?: None Help  from another person toileting, which includes using toliet, bedpan, or urinal?: None Help from another person bathing (including washing, rinsing, drying)?: None Help from another person to put on and taking off regular upper body clothing?: None Help from another person to put on and taking off regular lower body clothing?: None 6 Click Score: 24   End of Session Equipment Utilized During Treatment: Cervical collar  Activity Tolerance: Patient tolerated treatment well Patient left: in bed;with call bell/phone within reach  OT Visit Diagnosis: Muscle weakness (generalized) (M62.81)                Time: [id number] OT Time Calculation (min): 17 min Charges:  OT General Charges $OT Visit: 1 Visit OT Evaluation $OT Eval Moderate Complexity: 1 Mod   Brynn, OTR/L  Acute Rehabilitation Services Pager: [phone] Office: [phone] .   Jeri Modena 06/18/2019, 9:48 AM

## 2019-06-18 NOTE — Progress Notes (Signed)
Subjective: 1 Day Post-Op Procedure(s) (LRB): C5-6, C6-7  CERVICAL DECOMPRESSION/DISCECTOMY FUSION, ALLOGRAFT, PLATE (N/A) LEFT CARPAL TUNNEL RELEASE (Left) Patient reports pain as mild.  Numbness in left hand involving 2nd thru 5th fingers. Otherwise no complaints.   Objective: Vital signs in last 24 hours: Temp:  [97.7 F (36.5 C)-98.3 F (36.8 C)] 98.1 F (36.7 C) (11/14 0748) Pulse Rate:  [62-80] 64 (11/14 0748) Resp:  [7-21] 18 (11/14 0748) BP: (108-137)/(51-70) 110/60 (11/14 0748) SpO2:  [93 %-97 %] 94 % (11/14 0748)  Intake/Output from previous day: 11/13 0701 - 11/14 0700 In: 1243 [P.O.:240; I.V.:1003] Out: 175 [Drains:75; Blood:100] Intake/Output this shift: No intake/output data recorded.  No results for input(s): HGB in the last 72 hours. No results for input(s): WBC, RBC, HCT, PLT in the last 72 hours. No results for input(s): NA, K, CL, CO2, BUN, CREATININE, GLUCOSE, CALCIUM in the last 72 hours. No results for input(s): LABPT, INR in the last 72 hours.  Left hand dressing clean dry and intact. Able to wiggle fingers. Hand well perfused.  Cervical collar in place incision dressing clean and dry.    Assessment/Plan: 1 Day Post-Op Procedure(s) (LRB): C5-6, C6-7  CERVICAL DECOMPRESSION/DISCECTOMY FUSION, ALLOGRAFT, PLATE (N/A) LEFT CARPAL TUNNEL RELEASE (Left) Discharge home with home health Follow up with Dr. Lorin Mercy in one week.      Carrie Villa 06/18/2019, 8:34 AM

## 2019-06-21 ENCOUNTER — Encounter (HOSPITAL_COMMUNITY): Payer: Self-pay | Admitting: Orthopaedic Surgery

## 2019-06-23 ENCOUNTER — Encounter: Payer: Self-pay | Admitting: Orthopaedic Surgery

## 2019-06-23 ENCOUNTER — Ambulatory Visit (INDEPENDENT_AMBULATORY_CARE_PROVIDER_SITE_OTHER): Payer: MEDICARE

## 2019-06-23 ENCOUNTER — Ambulatory Visit (INDEPENDENT_AMBULATORY_CARE_PROVIDER_SITE_OTHER): Payer: MEDICARE | Admitting: Orthopaedic Surgery

## 2019-06-23 VITALS — Ht 65.5 in | Wt 136.0 lb

## 2019-06-23 DIAGNOSIS — Z981 Arthrodesis status: Secondary | ICD-10-CM

## 2019-06-23 NOTE — Progress Notes (Signed)
   Post-Op Visit Note   Patient: Carrie Villa           Date of Birth: 1946/05/23           MRN: [id number] Visit Date: 06/23/2019 PCP: Claretta Fraise, MD   Assessment & Plan: Post two-level cervical fusion and left carpal tunnel release.  Proximal suture untied.  Steri-Strips applied over the top.  She will return in 1 week for 6 suture removal on a Wednesday the day before Thanksgiving since the office is closed on Thanksgiving.  I will recheck her again in 5 weeks.  Lateral C-spine flexion-extension x-rays on return.  Pain is gone she still notices some numbness in her left index finger but no pain.  Chief Complaint:  Chief Complaint  Patient presents with  . Neck - Routine Post Op    06/17/2019 C5-6, C6-7 ACDF, Allograft, Plate  . Left Hand - Routine Post Op    06/17/2019 Left CTR   Visit Diagnoses:  1. Status post cervical spinal fusion     Plan: Return in 6 days on Wednesday for nurse visit for suture removal.  She will see me again in 5 weeks from today for lateral flexion-extension C-spine x-ray.  Follow-Up Instructions: No follow-ups on file.   Orders:  Orders Placed This Encounter  Procedures  . XR Cervical Spine 2 or 3 views   No orders of the defined types were placed in this encounter.   Imaging: No results found.  PMFS History: Patient Active Problem List   Diagnosis Date Noted  . Cervical stenosis of spine 06/17/2019  . Foraminal stenosis of cervical region 09/24/2018  . Carpal tunnel syndrome, left upper limb 09/24/2018  . 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 Anxiety   . Arthritis   . Cancer ([place])    Cervix  . Carpal tunnel syndrome    left  . Complication of anesthesia    felt clipping during colonoscopy  . Depression   . GERD (gastroesophageal reflux disease)   . Goiter   . Heart murmur   . History of blood transfusion     Family History  Problem Relation Age of Onset  . Cancer Mother     Past  Surgical History:  Procedure Laterality Date  . ABDOMINAL HYSTERECTOMY     partial   . ANTERIOR CERVICAL DECOMP/DISCECTOMY FUSION N/A 06/17/2019   Procedure: C5-6, C6-7  CERVICAL DECOMPRESSION/DISCECTOMY FUSION, ALLOGRAFT, PLATE;  Surgeon: Marybelle Killings, MD;  Location: [facility];  Service: Orthopedics;  Laterality: N/A;  . CARPAL TUNNEL RELEASE Left 06/17/2019   Procedure: LEFT CARPAL TUNNEL RELEASE;  Surgeon: Marybelle Killings, MD;  Location: [facility];  Service: Orthopedics;  Laterality: Left;  . COLONOSCOPY W/ POLYPECTOMY     x 4  . SHOULDER SURGERY Left    patient not sure what was done, "I had a small incision."   Social History   Occupational History  . Not on file  Tobacco Use  . Smoking status: Current Every Day Smoker    Packs/day: 0.75    Years: 61.00    Pack years: 45.75    Types: Cigarettes  . Smokeless tobacco: Never Used  Substance and Sexual Activity  . Alcohol use: Yes    Comment: occassionally  . Drug use: No  . Sexual activity: Yes

## 2019-06-28 NOTE — Discharge Summary (Signed)
Patient ID: Carrie Villa MRN: [id number] DOB/AGE: 1945-10-18 73 y.o.  Admit date: 06/17/2019 Discharge date: 06/28/2019  Admission Diagnoses:  Active Problems:   Cervical spondylosis   Foraminal stenosis of cervical region   Carpal tunnel syndrome, left upper limb   Discharge Diagnoses:  Active Problems:   Cervical spondylosis   Foraminal stenosis of cervical region   Carpal tunnel syndrome, left upper limb  status post Procedure(s): C5-6, C6-7  CERVICAL DECOMPRESSION/DISCECTOMY FUSION, ALLOGRAFT, PLATE LEFT CARPAL TUNNEL RELEASE  Past Medical History:  Diagnosis Date  . Anxiety   . Arthritis   . Cancer ([place])    Cervix  . Carpal tunnel syndrome    left  . Complication of anesthesia    felt clipping during colonoscopy  . Depression   . GERD (gastroesophageal reflux disease)   . Goiter   . Heart murmur   . History of blood transfusion     Surgeries: Procedure(s): C5-6, C6-7  CERVICAL DECOMPRESSION/DISCECTOMY FUSION, ALLOGRAFT, PLATE LEFT CARPAL TUNNEL RELEASE on 06/17/2019   Consultants:   Discharged Condition: Improved  Hospital Course: Carrie Villa is an 73 y.o. female who was admitted 06/17/2019 for operative treatment of cervical stenosis. Patient failed conservative treatments (please see the history and physical for the specifics) and had severe unremitting pain that affects sleep, daily activities and work/hobbies. After pre-op clearance, the patient was taken to the operating room on 06/17/2019 and underwent  Procedure(s): C5-6, C6-7  CERVICAL DECOMPRESSION/DISCECTOMY FUSION, ALLOGRAFT, PLATE LEFT CARPAL TUNNEL RELEASE.    Patient was given perioperative antibiotics:  Anti-infectives (From admission, onward)   Start     Dose/Rate Route Frequency Ordered Stop   06/17/19 1530  ceFAZolin (ANCEF) IVPB 1 g/50 mL premix     1 g 100 mL/hr over 30 Minutes Intravenous Every 8 hours 06/17/19 1151 06/17/19 2123   06/17/19 0631  ceFAZolin (ANCEF) 2-4  GM/100ML-% IVPB    Note to Pharmacy: Lorne Skeens   : cabinet override      06/17/19 0631 06/17/19 0724   06/17/19 0630  ceFAZolin (ANCEF) IVPB 2g/100 mL premix     2 g 200 mL/hr over 30 Minutes Intravenous On call to O.R. 06/17/19 [id number] 06/17/19 0724       Patient was given sequential compression devices and early ambulation to prevent DVT.   Patient benefited maximally from hospital stay and there were no complications. At the time of discharge, the patient was urinating/moving their bowels without difficulty, tolerating a regular diet, pain is controlled with oral pain medications and they have been cleared by PT/OT.   Recent vital signs: No data found.   Recent laboratory studies: No results for input(s): WBC, HGB, HCT, PLT, NA, K, CL, CO2, BUN, CREATININE, GLUCOSE, INR, CALCIUM in the last 72 hours.  Invalid input(s): PT, 2   Discharge Medications:   Allergies as of 06/18/2019      Reactions   Pravastatin    Foot pain at 20mg       Medication List    STOP taking these medications   pantoprazole 40 MG tablet Commonly known as: PROTONIX     TAKE these medications   busPIRone 10 MG tablet Commonly known as: BUSPAR Take 1 tablet (10 mg total) by mouth 2 (two) times daily. What changed: when to take this   cholecalciferol 25 MCG (1000 UT) tablet Commonly known as: VITAMIN D3 Take 1,000 Units by mouth daily.   citalopram 20 MG tablet Commonly known as: CELEXA Take 1 tablet (20 mg total) by  mouth daily. Must be seen What changed:   when to take this  additional instructions   cyclobenzaprine 5 MG tablet Commonly known as: FLEXERIL Take 1 tablet (5 mg total) by mouth 3 (three) times daily as needed for muscle spasms.   oxyCODONE-acetaminophen 5-325 MG tablet Commonly known as: PERCOCET/ROXICET Take 1-2 tablets by mouth every 6 (six) hours as needed for severe pain.   vitamin C 1000 MG tablet Take 1,000 mg by mouth daily.       Diagnostic Studies: Dg  Chest 2 View  Result Date: 06/14/2019 CLINICAL DATA:  Pre-op respiratory exam for cervical spine fusion. EXAM: CHEST - 2 VIEW COMPARISON:  None. FINDINGS: The heart size and mediastinal contours are within normal limits. Both lungs are clear. The visualized skeletal structures are unremarkable. IMPRESSION: No active cardiopulmonary disease. Electronically Signed   By: Marlaine Hind M.D.   On: 06/14/2019 16:43   Dg Cervical Spine 2-3 Views  Result Date: 06/17/2019 CLINICAL DATA:  Status post surgical anterior fusion of C5-6 and C6-7. EXAM: CERVICAL SPINE - 2-3 VIEW; DG C-ARM 1-60 MIN FLUOROSCOPY TIME:  13 seconds. COMPARISON:  September 24, 2017. FINDINGS: Five intraoperative fluoroscopic images were obtained of the cervical spine. These images demonstrate surgical localization and anterior fusion of the C5-6 and C6-7 disc spaces. Good alignment of vertebral bodies is noted. IMPRESSION: Fluoroscopic guidance provided during surgical anterior fusion of C5-6 and C6-7. Electronically Signed   By: Marijo Conception M.D.   On: 06/17/2019 10:13   Dg C-arm 1-60 Min  Result Date: 06/17/2019 CLINICAL DATA:  Status post surgical anterior fusion of C5-6 and C6-7. EXAM: CERVICAL SPINE - 2-3 VIEW; DG C-ARM 1-60 MIN FLUOROSCOPY TIME:  13 seconds. COMPARISON:  September 24, 2017. FINDINGS: Five intraoperative fluoroscopic images were obtained of the cervical spine. These images demonstrate surgical localization and anterior fusion of the C5-6 and C6-7 disc spaces. Good alignment of vertebral bodies is noted. IMPRESSION: Fluoroscopic guidance provided during surgical anterior fusion of C5-6 and C6-7. Electronically Signed   By: Marijo Conception M.D.   On: 06/17/2019 10:13   Xr Cervical Spine 2 Or 3 Views  Result Date: 06/23/2019 AP lateral C-spine x-rays demonstrate two-level cervical fusion C5-6 C6-7 good position of graft plate and screws. Impression: Satisfactory postop two-level cervical fusion.   Discharge  Instructions    Incentive spirometry RT   Complete by: As directed       Follow-up Information    Schedule an appointment as soon as possible for a visit with Marybelle Killings, MD.   Specialty: Orthopedic Surgery Why: need return office visit one week postop Contact information: [place] [address] [phone]           Discharge Plan:  discharge to home  Disposition:     Signed: Benjiman Core  06/28/2019, 4:24 PM

## 2019-07-05 ENCOUNTER — Encounter (HOSPITAL_COMMUNITY): Payer: Self-pay | Admitting: Orthopaedic Surgery

## 2019-07-06 ENCOUNTER — Other Ambulatory Visit: Payer: Self-pay

## 2019-07-07 ENCOUNTER — Ambulatory Visit: Payer: MEDICARE | Admitting: Family Medicine

## 2019-07-08 ENCOUNTER — Ambulatory Visit: Payer: MEDICARE | Admitting: Family Medicine

## 2019-07-11 ENCOUNTER — Other Ambulatory Visit: Payer: Self-pay

## 2019-07-12 ENCOUNTER — Encounter: Payer: Self-pay | Admitting: Family Medicine

## 2019-07-12 ENCOUNTER — Ambulatory Visit (INDEPENDENT_AMBULATORY_CARE_PROVIDER_SITE_OTHER): Payer: MEDICARE | Admitting: Family Medicine

## 2019-07-12 VITALS — BP 126/84 | HR 71 | Temp 99.0°F | Resp 20 | Ht 65.5 in | Wt 139.0 lb

## 2019-07-12 DIAGNOSIS — F419 Anxiety disorder, unspecified: Secondary | ICD-10-CM | POA: Diagnosis not present

## 2019-07-12 DIAGNOSIS — Z23 Encounter for immunization: Secondary | ICD-10-CM | POA: Diagnosis not present

## 2019-07-12 DIAGNOSIS — F339 Major depressive disorder, recurrent, unspecified: Secondary | ICD-10-CM | POA: Diagnosis not present

## 2019-07-12 MED ORDER — CITALOPRAM HYDROBROMIDE 20 MG PO TABS: 20.0000 mg | ORAL_TABLET | Freq: Every day | ORAL | 2 refills | Status: DC

## 2019-07-12 MED ORDER — BUSPIRONE HCL 10 MG PO TABS: 10.0000 mg | ORAL_TABLET | Freq: Every day | ORAL | 1 refills | Status: DC

## 2019-07-12 MED ORDER — CYCLOBENZAPRINE HCL 5 MG PO TABS: 5.0000 mg | ORAL_TABLET | Freq: Three times a day (TID) | ORAL | 1 refills | Status: DC | PRN

## 2019-07-12 NOTE — Progress Notes (Signed)
Subjective:  Patient ID: Carrie Villa, female    DOB: 1946-01-27  Age: 73 y.o. MRN: [id number]  CC: Medical Management of Chronic Issues (6 mo )   HPI Kynnedi Der presents for recheck of depression and anxiety. States she is stable, but occasionally on edge. PHQ score noted below. She does note some irritability, and feeling bad about herself.  Depression screen [facility] 2/9 07/12/2019 01/05/2019 08/17/2018  Decreased Interest 0 0 0  Down, Depressed, Hopeless 0 0 0  PHQ - 2 Score 0 0 0  Altered sleeping - - -  Tired, decreased energy - - -  Change in appetite - - -  Feeling bad or failure about yourself  - - -  Trouble concentrating - - -  Moving slowly or fidgety/restless - - -  Suicidal thoughts - - -  PHQ-9 Score - - -  Difficult doing work/chores - - -    History Marquesha has a past medical history of Anxiety, Arthritis, Cancer ([place]), Carpal tunnel syndrome, Complication of anesthesia, Depression, GERD (gastroesophageal reflux disease), Goiter, Heart murmur, and History of blood transfusion.   She has a past surgical history that includes Colonoscopy w/ polypectomy; Abdominal hysterectomy; Shoulder surgery (Left); Anterior cervical decomp/discectomy fusion (N/A, 06/17/2019); and Carpal tunnel release (Left, 06/17/2019).   Her family history includes Cancer in her mother.She reports that she has been smoking cigarettes. She has a 45.75 pack-year smoking history. She has never used smokeless tobacco. She reports current alcohol use. She reports that she does not use drugs.    ROS Review of Systems  Constitutional: Negative.   HENT: Negative.   Eyes: Negative for visual disturbance.  Respiratory: Negative for shortness of breath.   Cardiovascular: Negative for chest pain.  Gastrointestinal: Negative for abdominal pain.  Musculoskeletal: Negative for arthralgias.    Objective:  BP 126/84   Pulse 71   Temp 99 F (37.2 C)   Resp 20   Ht 5' 5.5" (1.664 m)   Wt 139 lb (63 kg)    SpO2 97%   BMI 22.78 kg/m   BP Readings from Last 3 Encounters:  07/12/19 126/84  06/18/19 110/60  06/14/19 137/71    Wt Readings from Last 3 Encounters:  07/12/19 139 lb (63 kg)  06/23/19 136 lb (61.7 kg)  06/17/19 136 lb 7.4 oz (61.9 kg)     Physical Exam Constitutional:      General: She is not in acute distress.    Appearance: She is well-developed.  Cardiovascular:     Rate and Rhythm: Normal rate and regular rhythm.  Pulmonary:     Breath sounds: Normal breath sounds.  Skin:    General: Skin is warm and dry.  Neurological:     Mental Status: She is alert and oriented to person, place, and time.       Assessment & Plan:   Annaliah was seen today for medical management of chronic issues.  Diagnoses and all orders for this visit:  Need for pneumococcal vaccination -     Pneumococcal conjugate vaccine 13-valent       I am having Mariann Laster Jackowski maintain her busPIRone, citalopram, vitamin C, cholecalciferol, oxyCODONE-acetaminophen, and cyclobenzaprine.  Allergies as of 07/12/2019      Reactions   Pravastatin    Foot pain at 20mg       Medication List       Accurate as of July 12, 2019  6:30 PM. If you have any questions, ask your nurse or doctor.  busPIRone 10 MG tablet Commonly known as: BUSPAR Take 1 tablet (10 mg total) by mouth 2 (two) times daily. What changed: when to take this   cholecalciferol 25 MCG (1000 UT) tablet Commonly known as: VITAMIN D3 Take 1,000 Units by mouth daily.   citalopram 20 MG tablet Commonly known as: CELEXA Take 1 tablet (20 mg total) by mouth daily. Must be seen What changed:   when to take this  additional instructions   cyclobenzaprine 5 MG tablet Commonly known as: FLEXERIL Take 1 tablet (5 mg total) by mouth 3 (three) times daily as needed for muscle spasms.   oxyCODONE-acetaminophen 5-325 MG tablet Commonly known as: PERCOCET/ROXICET Take 1-2 tablets by mouth every 6 (six) hours as  needed for severe pain.   vitamin C 1000 MG tablet Take 1,000 mg by mouth daily.        Follow-up: Return in about 6 months (around 01/10/2020).  Claretta Fraise, M.D.

## 2019-07-13 ENCOUNTER — Telehealth: Payer: Self-pay | Admitting: Family Medicine

## 2019-08-04 ENCOUNTER — Encounter: Payer: Self-pay | Admitting: Orthopaedic Surgery

## 2019-08-04 ENCOUNTER — Ambulatory Visit (INDEPENDENT_AMBULATORY_CARE_PROVIDER_SITE_OTHER): Payer: MEDICARE | Admitting: Orthopaedic Surgery

## 2019-08-04 ENCOUNTER — Ambulatory Visit (INDEPENDENT_AMBULATORY_CARE_PROVIDER_SITE_OTHER): Payer: MEDICARE

## 2019-08-04 VITALS — BP 132/70 | HR 85 | Ht 64.0 in | Wt 135.0 lb

## 2019-08-04 DIAGNOSIS — Z981 Arthrodesis status: Secondary | ICD-10-CM

## 2019-08-04 NOTE — Progress Notes (Signed)
   Post-Op Visit Note   Patient: Carrie Villa           Date of Birth: 1946/06/22           MRN: [id number] Visit Date: 08/04/2019 PCP: Claretta Fraise, MD   Assessment & Plan: Post two-level cervical fusion and left carpal tunnel release.  In case anything the day she has had little discomfort in her chest region and took a muscle relaxant.  Improvement in preoperative symptoms.  She is back to normal activities.  Flexion-extension x-rays show no motion.  Return as needed.  She is happy the surgical result.  Chief Complaint:  Chief Complaint  Patient presents with  . Neck - Follow-up    06/17/2019 C5-6, C6-7 ACDF, Allograft, Plate Left CTR   Visit Diagnoses:  1. Status post cervical spinal fusion     Plan: Post cervical fusion carpal tunnel release.  Patient is happy with the surgical result return as needed.  Follow-Up Instructions: No follow-ups on file.   Orders:  Orders Placed This Encounter  Procedures  . XR Cervical Spine 2 or 3 views   No orders of the defined types were placed in this encounter.   Imaging: No results found.  PMFS History: Patient Active Problem List   Diagnosis Date Noted  . Cervical stenosis of spine 06/17/2019  . Foraminal stenosis of cervical region 09/24/2018  . Carpal tunnel syndrome, left upper limb 09/24/2018  . 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 Anxiety   . Arthritis   . Cancer ([place])    Cervix  . Carpal tunnel syndrome    left  . Complication of anesthesia    felt clipping during colonoscopy  . Depression   . GERD (gastroesophageal reflux disease)   . Goiter   . Heart murmur   . History of blood transfusion     Family History  Problem Relation Age of Onset  . Cancer Mother     Past Surgical History:  Procedure Laterality Date  . ABDOMINAL HYSTERECTOMY     partial   . ANTERIOR CERVICAL DECOMP/DISCECTOMY FUSION N/A 06/17/2019   Procedure: C5-6, C6-7  CERVICAL  DECOMPRESSION/DISCECTOMY FUSION, ALLOGRAFT, PLATE;  Surgeon: Marybelle Killings, MD;  Location: [facility];  Service: Orthopedics;  Laterality: N/A;  . CARPAL TUNNEL RELEASE Left 06/17/2019   Procedure: LEFT CARPAL TUNNEL RELEASE;  Surgeon: Marybelle Killings, MD;  Location: [facility];  Service: Orthopedics;  Laterality: Left;  . COLONOSCOPY W/ POLYPECTOMY     x 4  . SHOULDER SURGERY Left    patient not sure what was done, "I had a small incision."   Social History   Occupational History  . Not on file  Tobacco Use  . Smoking status: Current Every Day Smoker    Packs/day: 0.75    Years: 61.00    Pack years: 45.75    Types: Cigarettes  . Smokeless tobacco: Never Used  Substance and Sexual Activity  . Alcohol use: Yes    Comment: occassionally  . Drug use: No  . Sexual activity: Yes

## 2019-08-10 ENCOUNTER — Telehealth: Payer: Self-pay

## 2019-08-10 NOTE — Telephone Encounter (Signed)
Called patient to set up appointment for Carrie Villa

## 2019-10-13 ENCOUNTER — Telehealth: Payer: Self-pay | Admitting: Family Medicine

## 2019-10-13 NOTE — Chronic Care Management (AMB) (Signed)
  Chronic Care Management   Outreach Note  10/13/2019 Name: Isadora Roylance MRN: [id number] DOB: [birth year]  Sujey Saxer is a 74 y.o. year old female who is a primary care patient of Stacks, Cletus Gash, MD. I reached out to [employer] by phone today in response to a referral sent by Ms. Mariann Laster Klooster's health plan.     An unsuccessful telephone outreach was attempted today. The patient was referred to the case management team for assistance with care management and care coordination.   Follow Up Plan: A HIPPA compliant phone message was left for the patient providing contact information and requesting a return call.  The care management team will reach out to the patient again over the next 7 days.  If patient returns call to provider office, please advise to call [facility] at [phone].  [place], [place] [address] Direct Dial: [phone] [email or web address] Website: [email or web address]

## 2019-10-13 NOTE — Chronic Care Management (AMB) (Signed)
  Chronic Care Management   Note  10/13/2019 Name: Carrie Villa MRN: [id number] DOB: February 25, 1946  Carrie Villa is a 74 y.o. year old female who is a primary care patient of Stacks, Cletus Gash, MD. I reached out to [employer] by phone today in response to a referral sent by Ms. [name]'s health plan.     Ms. [name] was given information about Chronic Care Management services today including:  1. CCM service includes personalized support from designated clinical staff supervised by her physician, including individualized plan of care and coordination with other care providers 2. 24/7 contact phone numbers for assistance for urgent and routine care needs. 3. Service will only be billed when office clinical staff spend 20 minutes or more in a month to coordinate care. 4. Only one practitioner may furnish and bill the service in a calendar month. 5. The patient may stop CCM services at any time (effective at the end of the month) by phone call to the office staff. 6. The patient will be responsible for cost sharing (co-pay) of up to 20% of the service fee (after annual deductible is met).  Patient agreed to services and verbal consent obtained.   Follow up plan: Telephone appointment with care management team member scheduled for: 01/17/2020.  [place], [place] [address] Direct Dial: [phone] [email or web address]: [email or web address]

## 2019-12-01 ENCOUNTER — Ambulatory Visit (INDEPENDENT_AMBULATORY_CARE_PROVIDER_SITE_OTHER): Payer: MEDICARE | Admitting: Orthopaedic Surgery

## 2019-12-01 ENCOUNTER — Ambulatory Visit (INDEPENDENT_AMBULATORY_CARE_PROVIDER_SITE_OTHER): Payer: MEDICARE

## 2019-12-01 ENCOUNTER — Other Ambulatory Visit: Payer: Self-pay

## 2019-12-01 ENCOUNTER — Encounter: Payer: Self-pay | Admitting: Orthopaedic Surgery

## 2019-12-01 VITALS — BP 145/91 | HR 69 | Ht 64.0 in | Wt 138.0 lb

## 2019-12-01 DIAGNOSIS — M79645 Pain in left finger(s): Secondary | ICD-10-CM

## 2019-12-01 DIAGNOSIS — G8929 Other chronic pain: Secondary | ICD-10-CM | POA: Diagnosis not present

## 2019-12-01 DIAGNOSIS — M25511 Pain in right shoulder: Secondary | ICD-10-CM

## 2019-12-01 DIAGNOSIS — M67921 Unspecified disorder of synovium and tendon, right upper arm: Secondary | ICD-10-CM

## 2019-12-01 DIAGNOSIS — Z981 Arthrodesis status: Secondary | ICD-10-CM | POA: Diagnosis not present

## 2019-12-01 DIAGNOSIS — M65312 Trigger thumb, left thumb: Secondary | ICD-10-CM

## 2019-12-05 DIAGNOSIS — M67921 Unspecified disorder of synovium and tendon, right upper arm: Secondary | ICD-10-CM

## 2019-12-05 DIAGNOSIS — M65312 Trigger thumb, left thumb: Secondary | ICD-10-CM | POA: Insufficient documentation

## 2019-12-05 DIAGNOSIS — Z981 Arthrodesis status: Secondary | ICD-10-CM | POA: Insufficient documentation

## 2019-12-05 MED ORDER — METHYLPREDNISOLONE ACETATE 40 MG/ML IJ SUSP
40.0000 mg | INTRAMUSCULAR | Status: AC | PRN
  Administered 2019-12-05: 40 mg via INTRA_ARTICULAR

## 2019-12-05 MED ORDER — BUPIVACAINE HCL 0.25 % IJ SOLN
2.0000 mL | INTRAMUSCULAR | Status: AC | PRN
  Administered 2019-12-05: 2 mL via INTRA_ARTICULAR

## 2019-12-05 MED ORDER — LIDOCAINE HCL 1 % IJ SOLN
0.5000 mL | INTRAMUSCULAR | Status: AC | PRN
  Administered 2019-12-05: .5 mL

## 2019-12-05 MED ORDER — BUPIVACAINE HCL 0.25 % IJ SOLN
0.3300 mL | INTRAMUSCULAR | Status: AC | PRN
  Administered 2019-12-05: .33 mL

## 2019-12-05 MED ORDER — METHYLPREDNISOLONE ACETATE 40 MG/ML IJ SUSP
13.3300 mg | INTRAMUSCULAR | Status: AC | PRN
  Administered 2019-12-05: 13.33 mg

## 2019-12-05 MED ORDER — LIDOCAINE HCL 1 % IJ SOLN
0.3000 mL | INTRAMUSCULAR | Status: AC | PRN
  Administered 2019-12-05: .3 mL

## 2019-12-05 NOTE — Progress Notes (Signed)
Office Visit Note   Patient: Carrie Villa           Date of Birth: Aug 11, 1945           MRN: [id number] Visit Date: 12/01/2019              Requested by: Claretta Fraise, MD [place],  [place] [address] PCP: Claretta Fraise, MD   Assessment & Plan: Visit Diagnoses:  1. Chronic right shoulder pain   2. Pain of left thumb   3. Hx of fusion of cervical spine   4. Trigger thumb, left thumb   5. Tendinopathy of right biceps tendon     Plan: Trigger thumb injection performed with good relief.  Right biceps tendon peritendinous injection performed.  We discussed activity modification.  She will return if she has persistent symptoms.  Follow-Up Instructions: No follow-ups on file.   Orders:  Orders Placed This Encounter  Procedures  . Hand/UE Inj  . Large Joint Inj  . XR Shoulder Right  . XR Hand Complete Left   No orders of the defined types were placed in this encounter.     Procedures: Hand/UE Inj: L thumb A1 for trigger finger on 12/05/2019 8:29 AM Medications: 0.3 mL lidocaine 1 %; 0.33 mL bupivacaine 0.25 %; 13.33 mg methylPREDNISolone acetate 40 MG/ML  Large Joint Inj: R glenohumeral on 12/05/2019 8:30 AM Indications: pain Details: 22 G 1.5 in needle  Arthrogram: No  Medications: 40 mg methylPREDNISolone acetate 40 MG/ML; 0.5 mL lidocaine 1 %; 2 mL bupivacaine 0.25 % Outcome: tolerated well, no immediate complications Procedure, treatment alternatives, risks and benefits explained, specific risks discussed. Consent was given by the patient. Immediately prior to procedure a time out was called to verify the correct patient, procedure, equipment, support staff and site/side marked as required. Patient was prepped and draped in the usual sterile fashion.       Clinical Data: No additional findings.   Subjective: Chief Complaint  Patient presents with  . Right Shoulder - Pain  . Left Hand - Pain    HPI 74 year old female returns post two-level  cervical fusion C5-6 C6-7 and left carpal tunnel release 06/17/2019.  Patient states her neck is doing well but she has had trouble with left thumb triggering.  Good relief with carpal tunnel symptoms on the left.  She is also had pain in her right shoulder and points directly over the long of the biceps tendon states whenever she does outstretch reaching or pulling pain is present in this area and she has difficulty reaching her arm up overhead.  Review of Systems 14 point update unchanged since her November 2020 surgery other than as mentioned HPI.   Objective: Vital Signs: BP (!) 145/91   Pulse 69   Ht 5\' 4"  (1.626 m)   Wt 138 lb (62.6 kg)   BMI 23.69 kg/m   Physical Exam Constitutional:      Appearance: She is well-developed.  HENT:     Head: Normocephalic.     Right Ear: External ear normal.     Left Ear: External ear normal.  Eyes:     Pupils: Pupils are equal, round, and reactive to light.  Neck:     Thyroid: No thyromegaly.     Trachea: No tracheal deviation.  Cardiovascular:     Rate and Rhythm: Normal rate.  Pulmonary:     Effort: Pulmonary effort is normal.  Abdominal:     Palpations: Abdomen is soft.  Skin:  General: Skin is warm and dry.  Neurological:     Mental Status: She is alert and oriented to person, place, and time.  Psychiatric:        Behavior: Behavior normal.     Ortho Exam patient is able to demonstrate triggering left thumb tenderness over the A1 pulley well-healed carpal tunnel incision excision is well-healed no brachial plexus tenderness.  Right biceps tendon exquisitely tender anteriorly negative Yergason positive Hawkins negative Neer negative drop arm test.  Upper semireflexes are 2+ and symmetrical normal heel toe gait.  Specialty Comments:  No specialty comments available.  Imaging: No results found.   PMFS History: Patient Active Problem List   Diagnosis Date Noted  . Hx of fusion of cervical spine 12/05/2019  . Trigger thumb,  left thumb 12/05/2019  . Tendinopathy of right biceps tendon 12/05/2019  . Foraminal stenosis of cervical region 09/24/2018  . Carpal tunnel syndrome, left upper limb 09/24/2018   Past Medical History:  Diagnosis Date  . Anxiety   . Arthritis   . Cancer ([place])    Cervix  . Carpal tunnel syndrome    left  . Complication of anesthesia    felt clipping during colonoscopy  . Depression   . GERD (gastroesophageal reflux disease)   . Goiter   . Heart murmur   . History of blood transfusion     Family History  Problem Relation Age of Onset  . Cancer Mother     Past Surgical History:  Procedure Laterality Date  . ABDOMINAL HYSTERECTOMY     partial   . ANTERIOR CERVICAL DECOMP/DISCECTOMY FUSION N/A 06/17/2019   Procedure: C5-6, C6-7  CERVICAL DECOMPRESSION/DISCECTOMY FUSION, ALLOGRAFT, PLATE;  Surgeon: Marybelle Killings, MD;  Location: [facility];  Service: Orthopedics;  Laterality: N/A;  . CARPAL TUNNEL RELEASE Left 06/17/2019   Procedure: LEFT CARPAL TUNNEL RELEASE;  Surgeon: Marybelle Killings, MD;  Location: [facility];  Service: Orthopedics;  Laterality: Left;  . COLONOSCOPY W/ POLYPECTOMY     x 4  . SHOULDER SURGERY Left    patient not sure what was done, "I had a small incision."   Social History   Occupational History  . Not on file  Tobacco Use  . Smoking status: Current Every Day Smoker    Packs/day: 0.75    Years: 61.00    Pack years: 45.75    Types: Cigarettes  . Smokeless tobacco: Never Used  Substance and Sexual Activity  . Alcohol use: Yes    Comment: occassionally  . Drug use: No  . Sexual activity: Yes

## 2019-12-07 ENCOUNTER — Telehealth: Payer: Self-pay | Admitting: Radiology

## 2019-12-07 NOTE — Telephone Encounter (Signed)
Please see message from [place] below and advise.  Pt has called with persistent right shoulder pain after injection. Trigger thumb is great. She wants to know what her next step should be. She is unable to be seen in tomorrow's clinic due to transportation.  Has been using heat patches with little or no relief.  Please contact her at [phone]  She [facility]

## 2019-12-07 NOTE — Telephone Encounter (Signed)
FYI,   I called discussed. She will give it one more week then call back if she wants to proceed with MRI. She will try to avoid outstretched reaching and pulling  etc.   RHD and not very coordinated with left hand she states. She will call back next week

## 2019-12-20 ENCOUNTER — Other Ambulatory Visit: Payer: Self-pay | Admitting: Family Medicine

## 2019-12-20 DIAGNOSIS — F419 Anxiety disorder, unspecified: Secondary | ICD-10-CM

## 2020-01-09 ENCOUNTER — Telehealth: Payer: Self-pay | Admitting: Orthopaedic Surgery

## 2020-01-09 ENCOUNTER — Telehealth: Payer: Self-pay | Admitting: Radiology

## 2020-01-09 DIAGNOSIS — M25511 Pain in right shoulder: Secondary | ICD-10-CM

## 2020-01-09 NOTE — Telephone Encounter (Signed)
Patient is still having trouble with her right shoulder and would like to get an MRI.  CB#[phone].  Thank you.

## 2020-01-09 NOTE — Telephone Encounter (Signed)
Duplicate message in chart. Awaiting Dr. Lorin Mercy return to office to advise.

## 2020-01-09 NOTE — Telephone Encounter (Signed)
Patient called and is no better after the right shoulder injection. She states that you advised her to call if no better and we would order MRI. She would like for this to be done in [place].  Please advise on order. CB for patient is [phone]

## 2020-01-10 ENCOUNTER — Other Ambulatory Visit: Payer: Self-pay

## 2020-01-10 ENCOUNTER — Other Ambulatory Visit: Payer: MEDICARE

## 2020-01-10 ENCOUNTER — Ambulatory Visit (INDEPENDENT_AMBULATORY_CARE_PROVIDER_SITE_OTHER): Payer: MEDICARE | Admitting: Family Medicine

## 2020-01-10 ENCOUNTER — Encounter: Payer: Self-pay | Admitting: Family Medicine

## 2020-01-10 VITALS — BP 136/83 | HR 61 | Temp 96.6°F | Resp 20 | Ht 64.0 in | Wt 140.1 lb

## 2020-01-10 DIAGNOSIS — F419 Anxiety disorder, unspecified: Secondary | ICD-10-CM | POA: Diagnosis not present

## 2020-01-10 DIAGNOSIS — E785 Hyperlipidemia, unspecified: Secondary | ICD-10-CM

## 2020-01-10 DIAGNOSIS — F339 Major depressive disorder, recurrent, unspecified: Secondary | ICD-10-CM | POA: Diagnosis not present

## 2020-01-10 DIAGNOSIS — G2581 Restless legs syndrome: Secondary | ICD-10-CM

## 2020-01-10 DIAGNOSIS — E039 Hypothyroidism, unspecified: Secondary | ICD-10-CM

## 2020-01-10 MED ORDER — ROPINIROLE HCL 1 MG PO TABS: 1.0000 mg | ORAL_TABLET | Freq: Every day | ORAL | 5 refills | Status: DC

## 2020-01-10 MED ORDER — BUSPIRONE HCL 10 MG PO TABS: ORAL_TABLET | ORAL | 1 refills | Status: DC

## 2020-01-10 MED ORDER — CITALOPRAM HYDROBROMIDE 20 MG PO TABS: 20.0000 mg | ORAL_TABLET | Freq: Every day | ORAL | 2 refills | Status: DC

## 2020-01-10 NOTE — Telephone Encounter (Signed)
Order entered. Patient requests [facility] because it is closer to home. She will call [place] office to schedule follow up appointment for MRI review. She knows to get CD from imaging facility.

## 2020-01-10 NOTE — Addendum Note (Signed)
Addended by: Meyer Cory on: 01/10/2020 03:28 PM   Modules accepted: Orders

## 2020-01-10 NOTE — Telephone Encounter (Signed)
Tell her if done in [facility] she will have to bring disc etc.  May be easier if done in [place]. ucall

## 2020-01-10 NOTE — Progress Notes (Signed)
Subjective:  Patient ID: Carrie Villa, female    DOB: Jul 22, 1946  Age: 74 y.o. MRN: [id number]  CC: Medical Management of Chronic Issues   HPI Carrie Villa presents for concern for leg cramps at night.  Primarily in the calves.  The left is more sore than the right.  Works for about a sleep.  Does not occur during the day.  Patient notes that she has had a history of hypothyroidism in the past but she took her self off of the thyroid medication and has not had significant symptoms recently.  She would like to have a "blood work test "today.  Patient continues to smoke.  She denies shortness of breath currently.  Depression screen noted below.  She continues to take her citalopram and needs refills on that.  Depression screen [facility] 2/9 01/10/2020 07/12/2019 01/05/2019  Decreased Interest 0 0 0  Down, Depressed, Hopeless 0 0 0  PHQ - 2 Score 0 0 0  Altered sleeping - - -  Tired, decreased energy - - -  Change in appetite - - -  Feeling bad or failure about yourself  - - -  Trouble concentrating - - -  Moving slowly or fidgety/restless - - -  Suicidal thoughts - - -  PHQ-9 Score - - -  Difficult doing work/chores - - -    History Carrie Villa has a past medical history of Anxiety, Arthritis, Cancer ([place]), Carpal tunnel syndrome, Complication of anesthesia, Depression, GERD (gastroesophageal reflux disease), Goiter, Heart murmur, and History of blood transfusion.   She has a past surgical history that includes Colonoscopy w/ polypectomy; Abdominal hysterectomy; Shoulder surgery (Left); Anterior cervical decomp/discectomy fusion (N/A, 06/17/2019); and Carpal tunnel release (Left, 06/17/2019).   Her family history includes Cancer in her mother.She reports that she has been smoking cigarettes. She has a 45.75 pack-year smoking history. She has never used smokeless tobacco. She reports current alcohol use. She reports that she does not use drugs.    ROS Review of Systems  Constitutional:  Negative.   HENT: Negative.   Eyes: Negative for visual disturbance.  Respiratory: Negative for shortness of breath.   Cardiovascular: Negative for chest pain.  Gastrointestinal: Negative for abdominal pain.  Musculoskeletal: Positive for arthralgias, myalgias and neck pain.    Objective:  BP 136/83    Pulse 61    Temp (!) 96.6 F (35.9 C) (Temporal)    Resp 20    Ht '5\' 4"'$  (1.626 m)    Wt 140 lb 2 oz (63.6 kg)    SpO2 97%    BMI 24.05 kg/m   BP Readings from Last 3 Encounters:  01/10/20 136/83  12/01/19 (!) 145/91  08/04/19 132/70    Wt Readings from Last 3 Encounters:  01/10/20 140 lb 2 oz (63.6 kg)  12/01/19 138 lb (62.6 kg)  08/04/19 135 lb (61.2 kg)     Physical Exam Constitutional:      General: She is not in acute distress.    Appearance: She is well-developed.  Cardiovascular:     Rate and Rhythm: Normal rate and regular rhythm.  Pulmonary:     Breath sounds: Normal breath sounds.  Musculoskeletal:     Comments: Both calves have full range of motion they are nontender nonedematous.  Free of lesion.  Skin:    General: Skin is warm and dry.  Neurological:     Mental Status: She is alert and oriented to person, place, and time.       Assessment &  Plan:   Sophira was seen today for medical management of chronic issues.  Diagnoses and all orders for this visit:  Recurrent major depressive disorder, remission status unspecified ([place]) -     CBC with Differential/Platelet -     CMP14+EGFR  Hyperlipidemia, unspecified hyperlipidemia type -     CBC with Differential/Platelet -     CMP14+EGFR -     Lipid panel  Anxiety -     busPIRone (BUSPAR) 10 MG tablet; TAKE 1 TABLET BY MOUTH EVERYDAY AT BEDTIME -     CBC with Differential/Platelet -     CMP14+EGFR  Depression, recurrent ([facility]) -     citalopram (CELEXA) 20 MG tablet; Take 1 tablet (20 mg total) by mouth at bedtime. -     CBC with Differential/Platelet -     CMP14+EGFR  Hypothyroidism, unspecified  type -     CBC with Differential/Platelet -     CMP14+EGFR -     TSH + free T4  Restless legs syndrome  Other orders -     rOPINIRole (REQUIP) 1 MG tablet; Take 1 tablet (1 mg total) by mouth at bedtime. For leg cramps       I have discontinued Carrie Villa's oxyCODONE-acetaminophen and cyclobenzaprine. I am also having her start on rOPINIRole. Additionally, I am having her maintain her vitamin C, cholecalciferol, busPIRone, and citalopram.  Allergies as of 01/10/2020      Reactions   Pravastatin    Foot pain at '20mg'$       Medication List       Accurate as of January 10, 2020 10:12 PM. If you have any questions, ask your nurse or doctor.        STOP taking these medications   cyclobenzaprine 5 MG tablet Commonly known as: FLEXERIL Stopped by: Claretta Fraise, MD   oxyCODONE-acetaminophen 5-325 MG tablet Commonly known as: PERCOCET/ROXICET Stopped by: Claretta Fraise, MD     TAKE these medications   busPIRone 10 MG tablet Commonly known as: BUSPAR TAKE 1 TABLET BY MOUTH EVERYDAY AT BEDTIME   cholecalciferol 25 MCG (1000 UNIT) tablet Commonly known as: VITAMIN D3 Take 1,000 Units by mouth daily.   citalopram 20 MG tablet Commonly known as: CELEXA Take 1 tablet (20 mg total) by mouth at bedtime.   rOPINIRole 1 MG tablet Commonly known as: REQUIP Take 1 tablet (1 mg total) by mouth at bedtime. For leg cramps Started by: Claretta Fraise, MD   vitamin C 1000 MG tablet Take 1,000 mg by mouth daily.     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   Follow-up: Return in about 6 months (around 07/11/2020), or if symptoms worsen or fail to improve.  Claretta Fraise, M.D.

## 2020-01-11 LAB — CBC WITH DIFFERENTIAL/PLATELET
Basophils Absolute: 0 10*3/uL (ref 0.0–0.2)
Basos: 0 %
EOS (ABSOLUTE): 0.1 10*3/uL (ref 0.0–0.4)
Eos: 1 %
Hematocrit: 42.8 % (ref 34.0–46.6)
Hemoglobin: 13.8 g/dL (ref 11.1–15.9)
Immature Grans (Abs): 0 10*3/uL (ref 0.0–0.1)
Immature Granulocytes: 0 %
Lymphocytes Absolute: 2.7 10*3/uL (ref 0.7–3.1)
Lymphs: 29 %
MCH: 29.9 pg (ref 26.6–33.0)
MCHC: 32.2 g/dL (ref 31.5–35.7)
MCV: 93 fL (ref 79–97)
Monocytes Absolute: 0.7 10*3/uL (ref 0.1–0.9)
Monocytes: 8 %
Neutrophils Absolute: 5.7 10*3/uL (ref 1.4–7.0)
Neutrophils: 62 %
Platelets: 122 10*3/uL — ABNORMAL LOW (ref 150–450)
RBC: 4.62 x10E6/uL (ref 3.77–5.28)
RDW: 12.5 % (ref 11.7–15.4)
WBC: 9.3 10*3/uL (ref 3.4–10.8)

## 2020-01-11 LAB — LIPID PANEL
Chol/HDL Ratio: 3.5 ratio (ref 0.0–4.4)
Cholesterol, Total: 237 mg/dL — ABNORMAL HIGH (ref 100–199)
HDL: 68 mg/dL (ref >39)
LDL Chol Calc (NIH): 148 mg/dL — ABNORMAL HIGH (ref 0–99)
Triglycerides: 119 mg/dL (ref 0–149)
VLDL Cholesterol Cal: 21 mg/dL (ref 5–40)

## 2020-01-11 LAB — CMP14+EGFR
ALT: 10 IU/L (ref 0–32)
AST: 19 IU/L (ref 0–40)
Albumin/Globulin Ratio: 2.1 (ref 1.2–2.2)
Albumin: 4.5 g/dL (ref 3.7–4.7)
Alkaline Phosphatase: 83 IU/L (ref 48–121)
BUN/Creatinine Ratio: 10 — ABNORMAL LOW (ref 12–28)
BUN: 7 mg/dL — ABNORMAL LOW (ref 8–27)
Bilirubin Total: 0.4 mg/dL (ref 0.0–1.2)
CO2: 24 mmol/L (ref 20–29)
Calcium: 9.2 mg/dL (ref 8.7–10.3)
Chloride: 103 mmol/L (ref 96–106)
Creatinine, Ser: 0.69 mg/dL (ref 0.57–1.00)
GFR calc Af Amer: 100 mL/min/{1.73_m2} (ref >59)
GFR calc non Af Amer: 87 mL/min/{1.73_m2} (ref >59)
Globulin, Total: 2.1 g/dL (ref 1.5–4.5)
Glucose: 87 mg/dL (ref 65–99)
Potassium: 4.4 mmol/L (ref 3.5–5.2)
Sodium: 140 mmol/L (ref 134–144)
Total Protein: 6.6 g/dL (ref 6.0–8.5)

## 2020-01-11 LAB — TSH+FREE T4
Free T4: 1.27 ng/dL (ref 0.82–1.77)
TSH: 1.03 u[IU]/mL (ref 0.450–4.500)

## 2020-01-17 ENCOUNTER — Telehealth: Payer: Self-pay | Admitting: *Deleted

## 2020-01-17 ENCOUNTER — Ambulatory Visit (INDEPENDENT_AMBULATORY_CARE_PROVIDER_SITE_OTHER): Payer: MEDICARE | Admitting: *Deleted

## 2020-01-17 ENCOUNTER — Other Ambulatory Visit: Payer: Self-pay | Admitting: Family Medicine

## 2020-01-17 DIAGNOSIS — E785 Hyperlipidemia, unspecified: Secondary | ICD-10-CM | POA: Insufficient documentation

## 2020-01-17 DIAGNOSIS — F325 Major depressive disorder, single episode, in full remission: Secondary | ICD-10-CM | POA: Insufficient documentation

## 2020-01-17 DIAGNOSIS — F339 Major depressive disorder, recurrent, unspecified: Secondary | ICD-10-CM | POA: Insufficient documentation

## 2020-01-17 MED ORDER — ROSUVASTATIN CALCIUM 10 MG PO TABS: 10.0000 mg | ORAL_TABLET | Freq: Every day | ORAL | 1 refills | Status: DC

## 2020-01-17 NOTE — Telephone Encounter (Signed)
Patient aware that cholesterol medication sent to pharmacy.

## 2020-01-17 NOTE — Patient Instructions (Signed)
Visit Information  Goals Addressed            This Visit's Progress   . Chronic Disease Management Needs       CARE PLAN ENTRY (see longtitudinal plan of care for additional care plan information)  Current Barriers:  . Chronic Disease Management support, education, and care coordination needs related to Arthritis, depression, HLD, anxiety, hypothyroidism  Clinical Goal(s) related to Arthritis, depression, HLD, anxiety, hypothyroidism:  Over the next 45 days, patient will:  . Work with the care management team to address educational, disease management, and care coordination needs  . Call provider office for new or worsened signs and symptoms . Call care management team with questions or concerns . Verbalize basic understanding of patient centered plan of care established today  Interventions related to Arthritis, depression, HLD, anxiety, hypothyroidism:  . Evaluation of current treatment plans and patient's adherence to plan as established by provider . Assessed patient understanding of disease states . Assessed patient's education and care coordination needs . Provided disease specific education to patient  . Collaborated with appropriate clinical care team members regarding patient needs . Reviewed and discussed medications o Cholesterol was elevated. Agreed to start cholesterol meds but they haven't been sent in to the pharmacy . RN will collaborate with PCP/clinical staff regarding cholesterol medication Rx . Encouraged patient to reach out to [facility] as needed . Reviewed upcoming appointments and MRI order that is pending scheduling  Patient Self Care Activities related to Arthritis, depression, HLD, anxiety, hypothyroidism:  . Patient is able to perform ADLs and IADLs independently . Patient needs assistance with managing chronic medical conditions  Initial goal documentation        Ms. Napp was given information about Chronic Care Management services today  including:  1. CCM service includes personalized support from designated clinical staff supervised by her physician, including individualized plan of care and coordination with other care providers 2. 24/7 contact phone numbers for assistance for urgent and routine care needs. 3. Service will only be billed when office clinical staff spend 20 minutes or more in a month to coordinate care. 4. Only one practitioner may furnish and bill the service in a calendar month. 5. The patient may stop CCM services at any time (effective at the end of the month) by phone call to the office staff. 6. The patient will be responsible for cost sharing (co-pay) of up to 20% of the service fee (after annual deductible is met).  Patient agreed to services and verbal consent obtained.   The patient verbalized understanding of instructions provided today and declined a print copy of patient instruction materials.   The care management team will reach out to the patient again over the next 45 days.   Chong Sicilian, BSN, RN-BC Embedded Chronic Care Manager [facility] / [place] Management Direct Dial: [phone]

## 2020-01-17 NOTE — Telephone Encounter (Signed)
Done. Thanks, WS 

## 2020-01-17 NOTE — Telephone Encounter (Signed)
01/17/2020  Per last lipid panel note, patient was to be prescribed cholesterol medication. This has not been sent to her pharmacy. She is requesting that [facility].  Forwarding to [facility] Clinical staff for review and assistance.   Chong Sicilian, BSN, RN-BC Embedded Chronic Care Manager [facility] / [place] Management Direct Dial: [phone]

## 2020-01-17 NOTE — Chronic Care Management (AMB) (Signed)
  Chronic Care Management   Initial Visit Note  01/17/2020 Name: Carrie Villa MRN: [id number] DOB: [birth year]  Referred by: Claretta Fraise, MD Reason for referral : Chronic Care Management (Initial Visit)   Carrie Villa is a 74 y.o. year old female who is a primary care patient of Stacks, Cletus Gash, MD. The CCM team was consulted for assistance with chronic disease management and care coordination needs related to Arthritis, depression, HLD, anxiety, hypothyroidism.  Review of patient status, including review of consultants reports, relevant laboratory and other test results, and collaboration with appropriate care team members and the patient's provider was performed as part of comprehensive patient evaluation and provision of chronic care management services.    Subjective: I spoke with [name] today by telephone regarding management of her chronic medical conditions.   SDOH (Social Determinants of Health) assessments performed: Yes See Care Plan activities for detailed interventions related to SDOH     Objective: Outpatient Encounter Medications as of 01/17/2020  Medication Sig  . Ascorbic Acid (VITAMIN C) 1000 MG tablet Take 1,000 mg by mouth daily.  . busPIRone (BUSPAR) 10 MG tablet TAKE 1 TABLET BY MOUTH EVERYDAY AT BEDTIME  . cholecalciferol (VITAMIN D3) 25 MCG (1000 UT) tablet Take 1,000 Units by mouth daily.  . citalopram (CELEXA) 20 MG tablet Take 1 tablet (20 mg total) by mouth at bedtime.  Marland Kitchen rOPINIRole (REQUIP) 1 MG tablet Take 1 tablet (1 mg total) by mouth at bedtime. For leg cramps   No facility-administered encounter medications on file as of 01/17/2020.     RN Care Plan   . Chronic Disease Management Needs       CARE PLAN ENTRY (see longtitudinal plan of care for additional care plan information)  Current Barriers:  . Chronic Disease Management support, education, and care coordination needs related to Arthritis, depression, HLD, anxiety,  hypothyroidism  Clinical Goal(s) related to Arthritis, depression, HLD, anxiety, hypothyroidism:  Over the next 45 days, patient will:  . Work with the care management team to address educational, disease management, and care coordination needs  . Call provider office for new or worsened signs and symptoms . Call care management team with questions or concerns . Verbalize basic understanding of patient centered plan of care established today  Interventions related to Arthritis, depression, HLD, anxiety, hypothyroidism:  . Evaluation of current treatment plans and patient's adherence to plan as established by provider . Assessed patient understanding of disease states . Assessed patient's education and care coordination needs . Provided disease specific education to patient  . Collaborated with appropriate clinical care team members regarding patient needs . Reviewed and discussed medications o Cholesterol was elevated. Agreed to start cholesterol meds but they haven't been sent in to the pharmacy . RN will collaborate with PCP/clinical staff regarding cholesterol medication Rx . Encouraged patient to reach out to [facility] as needed . Reviewed upcoming appointments and MRI order that is pending scheduling  Patient Self Care Activities related to Arthritis, depression, HLD, anxiety, hypothyroidism:  . Patient is able to perform ADLs and IADLs independently . Patient needs assistance with managing chronic medical conditions  Initial goal documentation        Plan:   The care management team will reach out to the patient again over the next 45 days.   Chong Sicilian, BSN, RN-BC Embedded Chronic Care Manager [facility] / [place] Management Direct Dial: [phone]

## 2020-01-26 ENCOUNTER — Encounter: Payer: Self-pay | Admitting: Orthopaedic Surgery

## 2020-01-26 ENCOUNTER — Ambulatory Visit (INDEPENDENT_AMBULATORY_CARE_PROVIDER_SITE_OTHER): Payer: MEDICARE | Admitting: Orthopaedic Surgery

## 2020-01-26 ENCOUNTER — Other Ambulatory Visit: Payer: Self-pay

## 2020-01-26 VITALS — BP 129/79 | HR 70 | Ht 64.0 in | Wt 140.0 lb

## 2020-01-26 DIAGNOSIS — M75121 Complete rotator cuff tear or rupture of right shoulder, not specified as traumatic: Secondary | ICD-10-CM | POA: Diagnosis not present

## 2020-01-26 DIAGNOSIS — M67921 Unspecified disorder of synovium and tendon, right upper arm: Secondary | ICD-10-CM | POA: Diagnosis not present

## 2020-01-31 DIAGNOSIS — M75121 Complete rotator cuff tear or rupture of right shoulder, not specified as traumatic: Secondary | ICD-10-CM | POA: Insufficient documentation

## 2020-01-31 NOTE — Progress Notes (Signed)
Office Visit Note   Patient: Carrie Villa           Date of Birth: 09-20-1945           MRN: [id number] Visit Date: 01/26/2020              Requested by: Claretta Fraise, MD [place],  [place] [id number] PCP: Claretta Fraise, MD   Assessment & Plan: Visit Diagnoses:  1. Tendinopathy of right biceps tendon   2. Nontraumatic complete tear of right rotator cuff     Plan: Patient has moderate glenohumeral arthritis with rotator cuff tear and atrophy.  There is moderate supraspinatus atrophy 4 cm tear with some retraction and I discussed with the patient that she may not be able to have the rotator cuff repaired due to retraction of the tendon.  Her principal problem is been subluxing long of the biceps tendon medially and we will proceed with arthroscopy arthroscopic debridement.  This to be done as outpatient questions were elicited and answered she understands request to proceed.  Follow-Up Instructions: No follow-ups on file.   Orders:  No orders of the defined types were placed in this encounter.  No orders of the defined types were placed in this encounter.     Procedures: No procedures performed   Clinical Data: No additional findings.   Subjective: Chief Complaint  Patient presents with  . Right Shoulder - Pain, Follow-up    MRI Right Shoulder Review    HPI 74 year old female returns with persistent problems with right shoulder pain.  MRI scan has been obtained and is available for review.  This shows rotator cuff tear with moderate muscle atrophy.  Subluxed biceps tendon medially which is consistent with some of her symptoms.  Moderate glenohumeral arthritis. Patient has had previous cervical fusion carpal tunnel release.  She continues to have anterior shoulder pain and points directly over the biceps tendon.  When patient bends her dresses she has sharp pop in her shoulder points anteriorly and states is very painful. Review of Systems 14 point system  update unchanged from 12/01/2019 office visit other than as mentioned in HPI.   Objective: Vital Signs: BP 129/79   Pulse 70   Ht 5\' 4"  (1.626 m)   Wt 140 lb (63.5 kg)   BMI 24.03 kg/m   Physical Exam Constitutional:      Appearance: She is well-developed.  HENT:     Head: Normocephalic.     Right Ear: External ear normal.     Left Ear: External ear normal.  Eyes:     Pupils: Pupils are equal, round, and reactive to light.  Neck:     Thyroid: No thyromegaly.     Trachea: No tracheal deviation.  Cardiovascular:     Rate and Rhythm: Normal rate.  Pulmonary:     Effort: Pulmonary effort is normal.  Abdominal:     Palpations: Abdomen is soft.  Skin:    General: Skin is warm and dry.  Neurological:     Mental Status: She is alert and oriented to person, place, and time.  Psychiatric:        Behavior: Behavior normal.     Ortho Exam patient has tenderness over the long head of the biceps right shoulder.  No brachial plexus tenderness negative Spurling.  Reflexes are 2+ and symmetrical.  She has pain with shoulder active range of motion.  Positive Hawkins test.  Negative Neer test positive drop arm test.  Sensation the  hand is normal. Specialty Comments:  No specialty comments available.  Imaging:   MRI shoulder [facility] 01/20/20 IMPRESSION:  Full-thickness, full width retracted tear centered at the critical zone of the supraspinatus tendon with mild to moderate muscle atrophy   Infraspinatus tendinosis and small partial tears with moderate muscle atrophy   Subscapularis tendinosis and partial-thickness intrasubstance tear   Bicipital tenosynovitis and focal medial subluxation.   Strain versus subacute denervation of the teres minor muscle without tendon tear.   Moderate hypertrophic A.C. joint arthrosis and subacromial spurring with evidence for subacromial outlet impingement.   Mild strain of the anterior deltoid muscle   Moderate glenohumeral osteoarthritis and joint  effusion. Degenerative SLAP pathology of the superior labrum   Electronically Signed by: Carrington Clamp    PMFS History: Patient Active Problem List   Diagnosis Date Noted  . Complete tear of right rotator cuff 01/31/2020  . Hyperlipidemia 01/17/2020  . Depression, recurrent ([place]) 01/17/2020  . Hx of fusion of cervical spine 12/05/2019  . Trigger thumb, left thumb 12/05/2019  . Tendinopathy of right biceps tendon 12/05/2019  . Foraminal stenosis of cervical region 09/24/2018  . Carpal tunnel syndrome, left upper limb 09/24/2018   Past Medical History:  Diagnosis Date  . Anxiety   . Arthritis   . Cancer ([place])    Cervix  . Carpal tunnel syndrome    left  . Complication of anesthesia    felt clipping during colonoscopy  . Depression   . GERD (gastroesophageal reflux disease)   . Goiter   . Heart murmur   . History of blood transfusion     Family History  Problem Relation Age of Onset  . Cancer Mother     Past Surgical History:  Procedure Laterality Date  . ABDOMINAL HYSTERECTOMY     partial   . ANTERIOR CERVICAL DECOMP/DISCECTOMY FUSION N/A 06/17/2019   Procedure: C5-6, C6-7  CERVICAL DECOMPRESSION/DISCECTOMY FUSION, ALLOGRAFT, PLATE;  Surgeon: Marybelle Killings, MD;  Location: [facility];  Service: Orthopedics;  Laterality: N/A;  . CARPAL TUNNEL RELEASE Left 06/17/2019   Procedure: LEFT CARPAL TUNNEL RELEASE;  Surgeon: Marybelle Killings, MD;  Location: [facility];  Service: Orthopedics;  Laterality: Left;  . COLONOSCOPY W/ POLYPECTOMY     x 4  . SHOULDER SURGERY Left    patient not sure what was done, "I had a small incision."   Social History   Occupational History  . Not on file  Tobacco Use  . Smoking status: Current Every Day Smoker    Packs/day: 0.75    Years: 61.00    Pack years: 45.75    Types: Cigarettes  . Smokeless tobacco: Never Used  Vaping Use  . Vaping Use: Never used  Substance and Sexual Activity  . Alcohol use: Yes    Comment: occassionally  . Drug use:  No  . Sexual activity: Yes

## 2020-03-05 ENCOUNTER — Other Ambulatory Visit: Payer: Self-pay | Admitting: Orthopaedic Surgery

## 2020-03-05 DIAGNOSIS — S43431A Superior glenoid labrum lesion of right shoulder, initial encounter: Secondary | ICD-10-CM | POA: Diagnosis not present

## 2020-03-05 DIAGNOSIS — M94211 Chondromalacia, right shoulder: Secondary | ICD-10-CM | POA: Diagnosis not present

## 2020-03-05 DIAGNOSIS — M7501 Adhesive capsulitis of right shoulder: Secondary | ICD-10-CM | POA: Diagnosis not present

## 2020-03-05 DIAGNOSIS — M75121 Complete rotator cuff tear or rupture of right shoulder, not specified as traumatic: Secondary | ICD-10-CM | POA: Diagnosis not present

## 2020-03-05 MED ORDER — HYDROCODONE-ACETAMINOPHEN 5-325 MG PO TABS: 1.0000 | ORAL_TABLET | Freq: Four times a day (QID) | ORAL | 0 refills | Status: DC | PRN

## 2020-03-15 ENCOUNTER — Encounter: Payer: Self-pay | Admitting: Orthopaedic Surgery

## 2020-03-15 ENCOUNTER — Other Ambulatory Visit: Payer: Self-pay

## 2020-03-15 ENCOUNTER — Ambulatory Visit (INDEPENDENT_AMBULATORY_CARE_PROVIDER_SITE_OTHER): Payer: MEDICARE | Admitting: Orthopaedic Surgery

## 2020-03-15 VITALS — BP 128/75 | Ht 64.5 in | Wt 139.0 lb

## 2020-03-15 DIAGNOSIS — M7062 Trochanteric bursitis, left hip: Secondary | ICD-10-CM | POA: Diagnosis not present

## 2020-03-15 DIAGNOSIS — M67921 Unspecified disorder of synovium and tendon, right upper arm: Secondary | ICD-10-CM | POA: Diagnosis not present

## 2020-03-15 NOTE — Progress Notes (Signed)
Office Visit Note   Patient: Carrie Villa           Date of Birth: June 21, 1946           MRN: [id number] Visit Date: 03/15/2020              Requested by: Claretta Fraise, MD [place],  [place] [id number] PCP: Claretta Fraise, MD   Assessment & Plan: Visit Diagnoses:  1. Tendinopathy of right biceps tendon   2. Trochanteric bursitis, left hip     Plan: Left trochanteric injection performed.  Sutures removed from her shoulder.  We discussed intraoperative findings.  Good relief post trochanteric injection.  Follow-Up Instructions: Return in about 5 weeks (around 04/19/2020).   Orders:  No orders of the defined types were placed in this encounter.  No orders of the defined types were placed in this encounter.     Procedures: Large Joint Inj: L greater trochanter on 03/15/2020 2:08 PM Details: lateral approach Medications: 0.5 mL lidocaine 1 %; 2 mL bupivacaine 0.25 %; 40 mg methylPREDNISolone acetate 40 MG/ML      Clinical Data: No additional findings.   Subjective: Chief Complaint  Patient presents with  . Right Shoulder - Routine Post Op    03/05/2020 Right shoulder arthroscopy    HPI 2 months post shoulder arthroscopy with 4 cm tear with retraction.  Debridement performed.  Biceps tendon was subluxing and it was released.  Patient states she has some pain when she moves it too much she states her shoulder is doing quite well today.  She has had a new problem which is left lateral trochanteric pain which is caused her to limp.  She points directly over the trochanter.  No pain down to her knee none down to the foot or ankle.  No bowel or bladder symptoms.  Review of Systems   Objective: Vital Signs: BP 128/75   Ht 5' 4.5" (1.638 m)   Wt 139 lb (63 kg)   BMI 23.49 kg/m   Physical Exam  Ortho Exam  Specialty Comments:  No specialty comments available.  Imaging: No results found.   PMFS History: Patient Active Problem List   Diagnosis Date  Noted  . Trochanteric bursitis, left hip 03/15/2020  . Complete tear of right rotator cuff 01/31/2020  . Hyperlipidemia 01/17/2020  . Depression, recurrent ([place]) 01/17/2020  . Hx of fusion of cervical spine 12/05/2019  . Trigger thumb, left thumb 12/05/2019  . Tendinopathy of right biceps tendon 12/05/2019  . Foraminal stenosis of cervical region 09/24/2018  . Carpal tunnel syndrome, left upper limb 09/24/2018   Past Medical History:  Diagnosis Date  . Anxiety   . Arthritis   . Cancer ([place])    Cervix  . Carpal tunnel syndrome    left  . Complication of anesthesia    felt clipping during colonoscopy  . Depression   . GERD (gastroesophageal reflux disease)   . Goiter   . Heart murmur   . History of blood transfusion     Family History  Problem Relation Age of Onset  . Cancer Mother     Past Surgical History:  Procedure Laterality Date  . ABDOMINAL HYSTERECTOMY     partial   . ANTERIOR CERVICAL DECOMP/DISCECTOMY FUSION N/A 06/17/2019   Procedure: C5-6, C6-7  CERVICAL DECOMPRESSION/DISCECTOMY FUSION, ALLOGRAFT, PLATE;  Surgeon: Marybelle Killings, MD;  Location: [facility];  Service: Orthopedics;  Laterality: N/A;  . CARPAL TUNNEL RELEASE Left 06/17/2019   Procedure:  LEFT CARPAL TUNNEL RELEASE;  Surgeon: Marybelle Killings, MD;  Location: [facility];  Service: Orthopedics;  Laterality: Left;  . COLONOSCOPY W/ POLYPECTOMY     x 4  . SHOULDER SURGERY Left    patient not sure what was done, "I had a small incision."   Social History   Occupational History  . Not on file  Tobacco Use  . Smoking status: Current Every Day Smoker    Packs/day: 0.75    Years: 61.00    Pack years: 45.75    Types: Cigarettes  . Smokeless tobacco: Never Used  Vaping Use  . Vaping Use: Never used  Substance and Sexual Activity  . Alcohol use: Yes    Comment: occassionally  . Drug use: No  . Sexual activity: Yes

## 2020-03-19 MED ORDER — BUPIVACAINE HCL 0.25 % IJ SOLN
2.0000 mL | INTRAMUSCULAR | Status: AC | PRN
  Administered 2020-03-15: 2 mL via INTRA_ARTICULAR

## 2020-03-19 MED ORDER — METHYLPREDNISOLONE ACETATE 40 MG/ML IJ SUSP
40.0000 mg | INTRAMUSCULAR | Status: AC | PRN
  Administered 2020-03-15: 40 mg via INTRA_ARTICULAR

## 2020-03-19 MED ORDER — LIDOCAINE HCL 1 % IJ SOLN
0.5000 mL | INTRAMUSCULAR | Status: AC | PRN
  Administered 2020-03-15: .5 mL

## 2020-04-19 ENCOUNTER — Other Ambulatory Visit: Payer: Self-pay

## 2020-04-19 ENCOUNTER — Encounter: Payer: Self-pay | Admitting: Orthopaedic Surgery

## 2020-04-19 ENCOUNTER — Ambulatory Visit (INDEPENDENT_AMBULATORY_CARE_PROVIDER_SITE_OTHER): Payer: MEDICARE | Admitting: Orthopaedic Surgery

## 2020-04-19 VITALS — Ht 64.5 in | Wt 139.0 lb

## 2020-04-19 DIAGNOSIS — M75121 Complete rotator cuff tear or rupture of right shoulder, not specified as traumatic: Secondary | ICD-10-CM

## 2020-04-19 NOTE — Progress Notes (Signed)
Office Visit Note   Patient: Carrie Villa           Date of Birth: August 12, 1945           MRN: [id number] Visit Date: 04/19/2020              Requested by: Claretta Fraise, MD [place],  [place] [id number] PCP: Claretta Fraise, MD   Assessment & Plan: Visit Diagnoses: Post rotator cuff debridement for severely retracted rotator cuff tear and biceps tendon release for subluxing biceps tendon partially torn right shoulder.  Plan: Patient will continue with activity modification avoid outstretched overhead activities we discussed moving some things in her kitchen to different cabinets.  If she decides she would like to have referral to Dr. Alphonzo Severance to consider reverse shoulder arthroplasty she will call and let us know.  She is walking well and continues to have some discomfort in the buttocks and pain with straight leg raising likely lumbar in origin.  Trochanteric injection gave her minimal relief if gets worse she can return.  Follow-Up Instructions: No follow-ups on file.   Orders:  No orders of the defined types were placed in this encounter.  No orders of the defined types were placed in this encounter.     Procedures: No procedures performed   Clinical Data: No additional findings.   Subjective: Chief Complaint  Patient presents with  . Right Shoulder - Follow-up    03/05/2020 Right shoulder arthroscopy  . Left Hip - Follow-up    HPI  Review of Systems   Objective: Vital Signs: Ht 5' 4.5" (1.638 m)   Wt 139 lb (63 kg)   BMI 23.49 kg/m   Physical Exam  Ortho Exam  Specialty Comments:  No specialty comments available.  Imaging: No results found.   PMFS History: Patient Active Problem List   Diagnosis Date Noted  . Trochanteric bursitis, left hip 03/15/2020  . Complete tear of right rotator cuff 01/31/2020  . Hyperlipidemia 01/17/2020  . Depression, recurrent ([facility]) 01/17/2020  . Hx of fusion of cervical spine 12/05/2019  . Trigger thumb,  left thumb 12/05/2019  . Tendinopathy of right biceps tendon 12/05/2019  . Foraminal stenosis of cervical region 09/24/2018  . Carpal tunnel syndrome, left upper limb 09/24/2018   Past Medical History:  Diagnosis Date  . Anxiety   . Arthritis   . Cancer ([facility])    Cervix  . Carpal tunnel syndrome    left  . Complication of anesthesia    felt clipping during colonoscopy  . Depression   . GERD (gastroesophageal reflux disease)   . Goiter   . Heart murmur   . History of blood transfusion     Family History  Problem Relation Age of Onset  . Cancer Mother     Past Surgical History:  Procedure Laterality Date  . ABDOMINAL HYSTERECTOMY     partial   . ANTERIOR CERVICAL DECOMP/DISCECTOMY FUSION N/A 06/17/2019   Procedure: C5-6, C6-7  CERVICAL DECOMPRESSION/DISCECTOMY FUSION, ALLOGRAFT, PLATE;  Surgeon: Marybelle Killings, MD;  Location: [facility];  Service: Orthopedics;  Laterality: N/A;  . CARPAL TUNNEL RELEASE Left 06/17/2019   Procedure: LEFT CARPAL TUNNEL RELEASE;  Surgeon: Marybelle Killings, MD;  Location: [facility];  Service: Orthopedics;  Laterality: Left;  . COLONOSCOPY W/ POLYPECTOMY     x 4  . SHOULDER SURGERY Left    patient not sure what was done, "I had a small incision."   Social History  Occupational History  . Not on file  Tobacco Use  . Smoking status: Current Every Day Smoker    Packs/day: 0.75    Years: 61.00    Pack years: 45.75    Types: Cigarettes  . Smokeless tobacco: Never Used  Vaping Use  . Vaping Use: Never used  Substance and Sexual Activity  . Alcohol use: Yes    Comment: occassionally  . Drug use: No  . Sexual activity: Yes

## 2020-04-26 ENCOUNTER — Telehealth: Payer: Self-pay | Admitting: Radiology

## 2020-04-26 NOTE — Telephone Encounter (Signed)
FYI  Patient called stating she is ready to see Dr. Marlou Sa in regards to shoulder surgery. Per last office note, she would call if she decided she would like to see him and we would refer her over.

## 2020-04-26 NOTE — Telephone Encounter (Signed)
Chelsea- Could you please call patient and schedule an appointment with Dr. Marlou Sa to discuss possible reverse total shoulder arthroplasty?  She is being referred by Dr. Lorin Mercy. Thanks.

## 2020-05-09 ENCOUNTER — Ambulatory Visit (INDEPENDENT_AMBULATORY_CARE_PROVIDER_SITE_OTHER): Payer: MEDICARE | Admitting: Orthopedic Surgery

## 2020-05-09 ENCOUNTER — Encounter: Payer: Self-pay | Admitting: Orthopedic Surgery

## 2020-05-09 DIAGNOSIS — M75121 Complete rotator cuff tear or rupture of right shoulder, not specified as traumatic: Secondary | ICD-10-CM

## 2020-05-09 NOTE — Progress Notes (Signed)
Office Visit Note   Patient: Carrie Villa           Date of Birth: [birth year]           MRN: [id number] Visit Date: 05/09/2020 Requested by: Carrie Fraise, MD [place],  [place] [id number] PCP: Carrie Fraise, MD  Subjective: Chief Complaint  Patient presents with  . Right Shoulder - Pain    HPI: Carrie Villa is a 74 year old patient with right shoulder pain.  Had shoulder surgery in the past 3 months for debridement of severely retracted rotator cuff tear and biceps tendon release for subluxating tendon.  She reports some continued pain.  She is very active around the house and likes to lift heavy things including the weedeater as well as logs.  She has reasonably maintained function but still has some pain and weakness with the right shoulder.  Takes over-the-counter medication at times.  Will very infrequently wake her from sleep.              ROS: All systems reviewed are negative as they relate to the chief complaint within the history of present illness.  Patient denies  fevers or chills.   Assessment & Plan: Visit Diagnoses:  1. Nontraumatic complete tear of right rotator cuff     Plan: Impression is early rotator cuff arthropathy.  Patient remains fairly functional at this time and does not want to comply with any type of weight lifting restriction which would be necessary after reverse shoulder replacement.  I think she may be heading for that in the future but for now she wants to live with what she has.  Follow-up as needed.  Follow-Up Instructions: Return if symptoms worsen or fail to improve.   Orders:  No orders of the defined types were placed in this encounter.  No orders of the defined types were placed in this encounter.     Procedures: No procedures performed   Clinical Data: No additional findings.  Objective: Vital Signs: There were no vitals taken for this visit.  Physical Exam:   Constitutional: Patient appears well-developed HEENT:  Head:  Normocephalic Eyes:EOM are normal Neck: Normal range of motion Cardiovascular: Normal rate Pulmonary/chest: Effort normal Neurologic: Patient is alert Skin: Skin is warm Psychiatric: Patient has normal mood and affect    Ortho Exam: Ortho exam demonstrates full active and passive range of motion of the left shoulder.  On the right-hand side she does have forward flexion and abduction and above 90 degrees but weakness to infraspinatus and supraspinatus testing.  Subscap strength also 5- out of 5.  Deltoid is functional.  Motor sensory function to the hand is intact.  No real biceps tenderness to palpation.  Neck range of motion is full.  Specialty Comments:  No specialty comments available.  Imaging: No results found.   PMFS History: Patient Active Problem List   Diagnosis Date Noted  . Trochanteric bursitis, left hip 03/15/2020  . Complete tear of right rotator cuff 01/31/2020  . Hyperlipidemia 01/17/2020  . Depression, recurrent ([facility]) 01/17/2020  . Hx of fusion of cervical spine 12/05/2019  . Trigger thumb, left thumb 12/05/2019  . Tendinopathy of right biceps tendon 12/05/2019  . Foraminal stenosis of cervical region 09/24/2018  . Carpal tunnel syndrome, left upper limb 09/24/2018   Past Medical History:  Diagnosis Date  . Anxiety   . Arthritis   . Cancer ([facility])    Cervix  . Carpal tunnel syndrome    left  . Complication  of anesthesia    felt clipping during colonoscopy  . Depression   . GERD (gastroesophageal reflux disease)   . Goiter   . Heart murmur   . History of blood transfusion     Family History  Problem Relation Age of Onset  . Cancer Mother     Past Surgical History:  Procedure Laterality Date  . ABDOMINAL HYSTERECTOMY     partial   . ANTERIOR CERVICAL DECOMP/DISCECTOMY FUSION N/A 06/17/2019   Procedure: C5-6, C6-7  CERVICAL DECOMPRESSION/DISCECTOMY FUSION, ALLOGRAFT, PLATE;  Surgeon: Marybelle Killings, MD;  Location: [facility];  Service: Orthopedics;   Laterality: N/A;  . CARPAL TUNNEL RELEASE Left 06/17/2019   Procedure: LEFT CARPAL TUNNEL RELEASE;  Surgeon: Marybelle Killings, MD;  Location: [facility];  Service: Orthopedics;  Laterality: Left;  . COLONOSCOPY W/ POLYPECTOMY     x 4  . SHOULDER SURGERY Left    patient not sure what was done, "I had a small incision."   Social History   Occupational History  . Not on file  Tobacco Use  . Smoking status: Current Every Day Smoker    Packs/day: 0.75    Years: 61.00    Pack years: 45.75    Types: Cigarettes  . Smokeless tobacco: Never Used  Vaping Use  . Vaping Use: Never used  Substance and Sexual Activity  . Alcohol use: Yes    Comment: occassionally  . Drug use: No  . Sexual activity: Yes

## 2020-06-22 ENCOUNTER — Telehealth: Payer: Self-pay | Admitting: *Deleted

## 2020-06-22 ENCOUNTER — Telehealth: Payer: MEDICARE | Admitting: *Deleted

## 2020-06-22 NOTE — Telephone Encounter (Signed)
  Chronic Care Management   Outreach Note  06/22/2020 Name: Carrie Villa MRN: [id number] DOB: [birth year]  Referred by: Claretta Fraise, MD Reason for referral : Chronic Care Management (RN follow up)   An unsuccessful telephone follow-up was attempted today. The patient was referred to the case management team for assistance with care management and care coordination.   Follow Up Plan: A HIPAA compliant phone message was left for the patient providing contact information and requesting a return call.  The care management team will reach out to the patient again over the next 30 days.   Chong Sicilian, BSN, RN-BC Embedded Chronic Care Manager [facility] Direct Dial: [phone]

## 2020-06-25 ENCOUNTER — Telehealth: Payer: Self-pay | Admitting: *Deleted

## 2020-06-25 NOTE — Telephone Encounter (Signed)
Please try to r/s if you have time

## 2020-06-25 NOTE — Telephone Encounter (Signed)
Called spoke with patient she refused to reschedule follow up call with RN CM.

## 2020-06-25 NOTE — Chronic Care Management (AMB) (Signed)
  Care Management   Note  06/25/2020 Name: Carrie Villa MRN: [id number] DOB: 05/13/1946  Carrie Villa is a 74 y.o. year old female who is a primary care patient of Stacks, Cletus Gash, MD and is actively engaged with the care management team. I reached out to Vito Berger by phone today to assist with re-scheduling a follow up visit with the RN Case Manager.  Follow up plan: Patient declines further follow up and engagement by the care management team. Appropriate care team members and provider have been notified via electronic communication. The care management team is available to follow up with the patient after provider conversation with the patient regarding recommendation for care management engagement and subsequent re-referral to the care management team.   [facility] Management  Direct Dial [phone]

## 2020-07-11 ENCOUNTER — Ambulatory Visit (INDEPENDENT_AMBULATORY_CARE_PROVIDER_SITE_OTHER): Payer: MEDICARE | Admitting: Family Medicine

## 2020-07-11 ENCOUNTER — Ambulatory Visit (INDEPENDENT_AMBULATORY_CARE_PROVIDER_SITE_OTHER): Payer: MEDICARE

## 2020-07-11 ENCOUNTER — Other Ambulatory Visit: Payer: Self-pay

## 2020-07-11 ENCOUNTER — Encounter: Payer: Self-pay | Admitting: Family Medicine

## 2020-07-11 VITALS — BP 136/80 | HR 68 | Temp 97.3°F | Resp 20 | Ht 64.5 in | Wt 139.4 lb

## 2020-07-11 DIAGNOSIS — F339 Major depressive disorder, recurrent, unspecified: Secondary | ICD-10-CM

## 2020-07-11 DIAGNOSIS — E785 Hyperlipidemia, unspecified: Secondary | ICD-10-CM

## 2020-07-11 DIAGNOSIS — Z780 Asymptomatic menopausal state: Secondary | ICD-10-CM

## 2020-07-11 DIAGNOSIS — M1990 Unspecified osteoarthritis, unspecified site: Secondary | ICD-10-CM

## 2020-07-11 DIAGNOSIS — F419 Anxiety disorder, unspecified: Secondary | ICD-10-CM | POA: Diagnosis not present

## 2020-07-11 DIAGNOSIS — F17210 Nicotine dependence, cigarettes, uncomplicated: Secondary | ICD-10-CM

## 2020-07-11 DIAGNOSIS — Z789 Other specified health status: Secondary | ICD-10-CM

## 2020-07-11 MED ORDER — BUSPIRONE HCL 10 MG PO TABS: ORAL_TABLET | ORAL | 1 refills | Status: AC

## 2020-07-11 MED ORDER — DICLOFENAC SODIUM 75 MG PO TBEC: 75.0000 mg | DELAYED_RELEASE_TABLET | Freq: Two times a day (BID) | ORAL | 2 refills | Status: AC

## 2020-07-11 MED ORDER — CITALOPRAM HYDROBROMIDE 20 MG PO TABS: 20.0000 mg | ORAL_TABLET | Freq: Every day | ORAL | 1 refills | Status: AC

## 2020-07-11 NOTE — Progress Notes (Signed)
Subjective:  Patient ID: Carrie Villa, female    DOB: [birth year]  Age: 74 y.o. MRN: [id number]  CC: Medical Management of Chronic Issues   HPI [name] presents for pain all over her body.  This is present chronically.  Currently the low back pain is severe with household chores.  So bad it makes her nauseated.  She says it is radiating to the left lower extremity.  She is suffering from a great deal of depression she is worrying about her kids but says that she is coping on current regimen.  Although she has high cholesterol she discontinued Crestor after several days due to constipation.  She previously reported reaction to pravastatin.  That medication caused foot pain at 20 mg.  She has history of COPD and is a current smoker with a 60-pack-year history.  Which letter for know I have been started yet sorry do not take away Ambien and drink it.  The cold I am sorry just trying to get to a stopping point now that this is great thank you sorry I  Depression screen [facility] 2/9 07/11/2020 01/10/2020 07/12/2019  Decreased Interest 0 0 0  Down, Depressed, Hopeless 0 0 0  PHQ - 2 Score 0 0 0  Altered sleeping - - -  Tired, decreased energy - - -  Change in appetite - - -  Feeling bad or failure about yourself  - - -  Trouble concentrating - - -  Moving slowly or fidgety/restless - - -  Suicidal thoughts - - -  PHQ-9 Score - - -  Difficult doing work/chores - - -    History Myracle has a past medical history of Anxiety, Arthritis, Cancer ([place]), Carpal tunnel syndrome, Complication of anesthesia, Depression, GERD (gastroesophageal reflux disease), Goiter, Heart murmur, and History of blood transfusion.   She has a past surgical history that includes Colonoscopy w/ polypectomy; Abdominal hysterectomy; Shoulder surgery (Left); Anterior cervical decomp/discectomy fusion (N/A, 06/17/2019); and Carpal tunnel release (Left, 06/17/2019).   Her family history includes Cancer in her mother.She  reports that she has been smoking cigarettes. She has a 45.75 pack-year smoking history. She has never used smokeless tobacco. She reports current alcohol use. She reports that she does not use drugs.    ROS Review of Systems  Constitutional: Negative.   HENT: Negative.   Eyes: Negative for visual disturbance.  Respiratory: Negative for shortness of breath.   Cardiovascular: Negative for chest pain.  Gastrointestinal: Negative for abdominal pain.  Musculoskeletal: Negative for arthralgias.    Objective:  BP 136/80    Pulse 68    Temp (!) 97.3 F (36.3 C) (Temporal)    Resp 20    Ht 5' 4.5" (1.638 m)    Wt 139 lb 6 oz (63.2 kg)    SpO2 95%    BMI 23.55 kg/m   BP Readings from Last 3 Encounters:  07/11/20 136/80  03/15/20 128/75  01/26/20 129/79    Wt Readings from Last 3 Encounters:  07/11/20 139 lb 6 oz (63.2 kg)  04/19/20 139 lb (63 kg)  03/15/20 139 lb (63 kg)     Physical Exam Constitutional:      General: She is not in acute distress.    Appearance: She is well-developed and well-nourished.  HENT:     Head: Normocephalic and atraumatic.  Eyes:     Conjunctiva/sclera: Conjunctivae normal.     Pupils: Pupils are equal, round, and reactive to light.  Neck:  Thyroid: No thyromegaly.  Cardiovascular:     Rate and Rhythm: Normal rate and regular rhythm.     Heart sounds: Normal heart sounds. No murmur heard.   Pulmonary:     Effort: Pulmonary effort is normal. No respiratory distress.     Breath sounds: Normal breath sounds. No wheezing or rales.  Abdominal:     General: Bowel sounds are normal. There is no distension.     Palpations: Abdomen is soft.     Tenderness: There is no abdominal tenderness.  Musculoskeletal:        General: Normal range of motion.     Cervical back: Normal range of motion and neck supple.  Lymphadenopathy:     Cervical: No cervical adenopathy.  Skin:    General: Skin is warm and dry.  Neurological:     Mental Status: She is  alert and oriented to person, place, and time.  Psychiatric:        Mood and Affect: Mood and affect normal.        Behavior: Behavior normal.        Thought Content: Thought content normal.        Judgment: Judgment normal.       Assessment & Plan:   Nevin was seen today for medical management of chronic issues.  Diagnoses and all orders for this visit:  Hyperlipidemia, unspecified hyperlipidemia type -     CBC with Differential/Platelet -     CMP14+EGFR -     Lipid panel  Depression, recurrent ([facility]) -     citalopram (CELEXA) 20 MG tablet; Take 1 tablet (20 mg total) by mouth at bedtime. -     CBC with Differential/Platelet -     CMP14+EGFR -     Lipid panel  Anxiety -     busPIRone (BUSPAR) 10 MG tablet; TAKE 1 TABLET BY MOUTH EVERYDAY AT BEDTIME -     CBC with Differential/Platelet -     CMP14+EGFR -     Lipid panel  Arthritis -     CBC with Differential/Platelet -     CMP14+EGFR -     Lipid panel -     DG Lumbar Spine 2-3 Views; Future  Smokes with greater than 40 pack year history -     CT CHEST LUNG CANCER SCREENING LOW DOSE WO CONTRAST; Future -     CBC with Differential/Platelet -     CMP14+EGFR -     Lipid panel  Statin intolerance  Other orders -     diclofenac (VOLTAREN) 75 MG EC tablet; Take 1 tablet (75 mg total) by mouth 2 (two) times daily. For muscle and  Joint pain       I have discontinued Doshie Case's cholecalciferol, rOPINIRole, rosuvastatin, and HYDROcodone-acetaminophen. I am also having her start on diclofenac. Additionally, I am having her maintain her vitamin C, citalopram, and busPIRone.  Allergies as of 07/11/2020      Reactions   Pravastatin    Foot pain at $Remov'20mg'IZUwkB$       Medication List       Accurate as of July 11, 2020 11:59 PM. If you have any questions, ask your nurse or doctor.        STOP taking these medications   cholecalciferol 25 MCG (1000 UNIT) tablet Commonly known as: VITAMIN D3 Stopped by: Claretta Fraise, MD   HYDROcodone-acetaminophen 5-325 MG tablet Commonly known as: NORCO/VICODIN Stopped by: Claretta Fraise, MD   rOPINIRole 1 MG tablet Commonly  known as: REQUIP Stopped by: Claretta Fraise, MD   rosuvastatin 10 MG tablet Commonly known as: Crestor Stopped by: Claretta Fraise, MD     TAKE these medications   busPIRone 10 MG tablet Commonly known as: BUSPAR TAKE 1 TABLET BY MOUTH EVERYDAY AT BEDTIME   citalopram 20 MG tablet Commonly known as: CELEXA Take 1 tablet (20 mg total) by mouth at bedtime.   diclofenac 75 MG EC tablet Commonly known as: VOLTAREN Take 1 tablet (75 mg total) by mouth 2 (two) times daily. For muscle and  Joint pain Started by: Claretta Fraise, MD   vitamin C 1000 MG tablet Take 1,000 mg by mouth daily.        Follow-up: Return in about 6 months (around 01/09/2021).  Claretta Fraise, M.D.

## 2020-07-12 LAB — CBC WITH DIFFERENTIAL/PLATELET
Basophils Absolute: 0 10*3/uL (ref 0.0–0.2)
Basos: 0 %
EOS (ABSOLUTE): 0.1 10*3/uL (ref 0.0–0.4)
Eos: 2 %
Hematocrit: 38.9 % (ref 34.0–46.6)
Hemoglobin: 13 g/dL (ref 11.1–15.9)
Immature Grans (Abs): 0 10*3/uL (ref 0.0–0.1)
Immature Granulocytes: 0 %
Lymphocytes Absolute: 2.8 10*3/uL (ref 0.7–3.1)
Lymphs: 32 %
MCH: 30.6 pg (ref 26.6–33.0)
MCHC: 33.4 g/dL (ref 31.5–35.7)
MCV: 92 fL (ref 79–97)
Monocytes Absolute: 0.8 10*3/uL (ref 0.1–0.9)
Monocytes: 9 %
Neutrophils Absolute: 5.1 10*3/uL (ref 1.4–7.0)
Neutrophils: 57 %
Platelets: 102 10*3/uL — ABNORMAL LOW (ref 150–450)
RBC: 4.25 x10E6/uL (ref 3.77–5.28)
RDW: 11.7 % (ref 11.7–15.4)
WBC: 9 10*3/uL (ref 3.4–10.8)

## 2020-07-12 LAB — CMP14+EGFR
ALT: 9 IU/L (ref 0–32)
AST: 14 IU/L (ref 0–40)
Albumin/Globulin Ratio: 2.3 — ABNORMAL HIGH (ref 1.2–2.2)
Albumin: 4.4 g/dL (ref 3.7–4.7)
Alkaline Phosphatase: 74 IU/L (ref 44–121)
BUN/Creatinine Ratio: 18 (ref 12–28)
BUN: 11 mg/dL (ref 8–27)
Bilirubin Total: 0.2 mg/dL (ref 0.0–1.2)
CO2: 26 mmol/L (ref 20–29)
Calcium: 9.6 mg/dL (ref 8.7–10.3)
Chloride: 103 mmol/L (ref 96–106)
Creatinine, Ser: 0.61 mg/dL (ref 0.57–1.00)
GFR calc Af Amer: 104 mL/min/{1.73_m2} (ref >59)
GFR calc non Af Amer: 90 mL/min/{1.73_m2} (ref >59)
Globulin, Total: 1.9 g/dL (ref 1.5–4.5)
Glucose: 85 mg/dL (ref 65–99)
Potassium: 5.1 mmol/L (ref 3.5–5.2)
Sodium: 140 mmol/L (ref 134–144)
Total Protein: 6.3 g/dL (ref 6.0–8.5)

## 2020-07-12 LAB — LIPID PANEL
Chol/HDL Ratio: 3.1 ratio (ref 0.0–4.4)
Cholesterol, Total: 216 mg/dL — ABNORMAL HIGH (ref 100–199)
HDL: 70 mg/dL (ref >39)
LDL Chol Calc (NIH): 132 mg/dL — ABNORMAL HIGH (ref 0–99)
Triglycerides: 78 mg/dL (ref 0–149)
VLDL Cholesterol Cal: 14 mg/dL (ref 5–40)

## 2020-07-13 ENCOUNTER — Other Ambulatory Visit: Payer: Self-pay

## 2020-07-13 MED ORDER — ALENDRONATE SODIUM 70 MG PO TABS: 70.0000 mg | ORAL_TABLET | ORAL | 0 refills | Status: DC

## 2020-07-15 ENCOUNTER — Encounter: Payer: Self-pay | Admitting: Family Medicine

## 2020-07-17 ENCOUNTER — Encounter (HOSPITAL_COMMUNITY): Payer: Self-pay

## 2020-07-17 NOTE — Progress Notes (Signed)
Received referral for initial lung cancer screening scan. Contacted patient and explained the lung cancer screening program. Patient reports she is not interested at this time. Referring provider made aware.

## 2020-08-29 ENCOUNTER — Ambulatory Visit (INDEPENDENT_AMBULATORY_CARE_PROVIDER_SITE_OTHER): Payer: Medicare HMO | Admitting: Family Medicine

## 2020-08-29 ENCOUNTER — Encounter: Payer: Self-pay | Admitting: Family Medicine

## 2020-08-29 DIAGNOSIS — J0100 Acute maxillary sinusitis, unspecified: Secondary | ICD-10-CM | POA: Diagnosis not present

## 2020-08-29 MED ORDER — AMOXICILLIN 875 MG PO TABS: 875.0000 mg | ORAL_TABLET | Freq: Two times a day (BID) | ORAL | 0 refills | Status: AC

## 2020-08-29 NOTE — Progress Notes (Signed)
   Virtual Visit via telephone Note Due to COVID-19 pandemic this visit was conducted virtually. This visit type was conducted due to national recommendations for restrictions regarding the COVID-19 Pandemic (e.g. social distancing, sheltering in place) in an effort to limit this patient's exposure and mitigate transmission in our community. All issues noted in this document were discussed and addressed.  A physical exam was not performed with this format.  I connected with Carrie Villa on 08/29/20 at 0955 by telephone and verified that I am speaking with the correct person using two identifiers. Carrie Villa is currently located at home and no one is currently with her during visit. The provider, Gwenlyn Perking, FNP is located in their office at time of visit.  I discussed the limitations, risks, security and privacy concerns of performing an evaluation and management service by telephone and the availability of in person appointments. I also discussed with the patient that there may be a patient responsible charge related to this service. The patient expressed understanding and agreed to proceed.  CC: congestion  History and Present Illness:  HPI  [name] reports head and nasal congestion for about 6 days. She also reports post nasal drip and facial pain/pressure around her nose. She reports a mild sore throat that feels raw and a mild dry cough. She has some nausea that comes and goes when the postnasal drip is the worst. She feels like her congestion has gotten worse despite taking dayquil. She reports that she gets a sinus infection about once a year that requires an antibiotic. She has been vaccinated against Covid and has had her booster. She denies known exposure. Denies chest pain, shortness of breath, fever, body aches, chills, or vomiting.   ROS As per HPI.   Observations/Objective: Alert and oriented x 3. Able to speak in full sentences without difficulty.   Assessment and  Plan: [place] was seen today for nasal congestion.  Diagnoses and all orders for this visit:  Acute non-recurrent maxillary sinusitis Discussed that symptoms could be due to a Covid infection. Patient does not want to be tested. Will treat with antiobiotics given history and facial pressure. Mucinex and nasal saline for congestion. Stay well hydrated. Return to office for new or worsening symptoms, or if symptoms persist.  -     amoxicillin (AMOXIL) 875 MG tablet; Take 1 tablet (875 mg total) by mouth 2 (two) times daily for 7 days.  Follow Up Instructions: As needed.     I discussed the assessment and treatment plan with the patient. The patient was provided an opportunity to ask questions and all were answered. The patient agreed with the plan and demonstrated an understanding of the instructions.   The patient was advised to call back or seek an in-person evaluation if the symptoms worsen or if the condition fails to improve as anticipated.  The above assessment and management plan was discussed with the patient. The patient verbalized understanding of and has agreed to the management plan. Patient is aware to call the clinic if symptoms persist or worsen. Patient is aware when to return to the clinic for a follow-up visit. Patient educated on when it is appropriate to go to the emergency department.   Time call ended:  [place], Sherrill

## 2020-09-29 ENCOUNTER — Other Ambulatory Visit: Payer: Self-pay | Admitting: Family Medicine

## 2020-10-23 DIAGNOSIS — Z8541 Personal history of malignant neoplasm of cervix uteri: Secondary | ICD-10-CM | POA: Insufficient documentation

## 2020-10-23 DIAGNOSIS — Z86010 Personal history of colonic polyps: Secondary | ICD-10-CM | POA: Insufficient documentation

## 2020-10-23 DIAGNOSIS — M4316 Spondylolisthesis, lumbar region: Secondary | ICD-10-CM | POA: Insufficient documentation

## 2020-10-23 DIAGNOSIS — D696 Thrombocytopenia, unspecified: Secondary | ICD-10-CM | POA: Insufficient documentation

## 2020-12-29 ENCOUNTER — Other Ambulatory Visit: Payer: Self-pay | Admitting: Family Medicine

## 2021-01-02 ENCOUNTER — Other Ambulatory Visit: Payer: Self-pay | Admitting: Family Medicine

## 2021-01-10 ENCOUNTER — Ambulatory Visit: Payer: Medicare Other | Admitting: Family Medicine

## 2022-03-25 DIAGNOSIS — M5136 Other intervertebral disc degeneration, lumbar region: Secondary | ICD-10-CM | POA: Insufficient documentation

## 2022-05-19 ENCOUNTER — Other Ambulatory Visit: Payer: Self-pay

## 2022-05-19 ENCOUNTER — Other Ambulatory Visit: Payer: Medicare PPO | Attending: FAMILY PRACTICE

## 2022-05-19 DIAGNOSIS — E039 Hypothyroidism, unspecified: Secondary | ICD-10-CM | POA: Insufficient documentation

## 2022-05-19 DIAGNOSIS — E785 Hyperlipidemia, unspecified: Secondary | ICD-10-CM | POA: Insufficient documentation

## 2022-05-19 DIAGNOSIS — J40 Bronchitis, not specified as acute or chronic: Secondary | ICD-10-CM | POA: Insufficient documentation

## 2022-05-19 DIAGNOSIS — R0600 Dyspnea, unspecified: Secondary | ICD-10-CM | POA: Insufficient documentation

## 2022-05-19 LAB — LIPID PANEL
CHOL/HDL RATIO: 2.9
CHOLESTEROL: 214 mg/dL — ABNORMAL HIGH (ref <200)
HDL CHOL: 74 mg/dL (ref 23–92)
LDL CALC: 116 mg/dL — ABNORMAL HIGH (ref 0–100)
TRIGLYCERIDES: 121 mg/dL (ref <=150)
VLDL CALC: 24 mg/dL (ref 0–50)

## 2022-05-19 LAB — CBC WITH DIFF
BASOPHIL #: 0 10*3/uL (ref 0.00–0.10)
BASOPHIL %: 0 % (ref 0–1)
EOSINOPHIL #: 0.1 10*3/uL (ref 0.00–0.50)
EOSINOPHIL %: 1 %
HCT: 41.6 % (ref 31.2–41.9)
HGB: 14.1 g/dL (ref 10.9–14.3)
LYMPHOCYTE #: 2 10*3/uL (ref 1.00–3.00)
LYMPHOCYTE %: 25 % (ref 16–44)
MCH: 31.3 pg (ref 24.7–32.8)
MCHC: 33.8 g/dL (ref 32.3–35.6)
MCV: 92.8 fL (ref 75.5–95.3)
MONOCYTE #: 0.7 10*3/uL (ref 0.30–1.00)
MONOCYTE %: 8 % (ref 5–13)
MPV: 12.4 fL — ABNORMAL HIGH (ref 7.9–10.8)
NEUTROPHIL #: 5.2 10*3/uL (ref 1.85–7.80)
NEUTROPHIL %: 66 % (ref 43–77)
PLATELETS: 100 10*3/uL — ABNORMAL LOW (ref 140–440)
RBC: 4.49 10*6/uL (ref 3.63–4.92)
RDW: 13.7 % (ref 12.3–17.7)
WBC: 8 10*3/uL (ref 3.8–11.8)

## 2022-05-19 LAB — BASIC METABOLIC PANEL
ANION GAP: 7 mmol/L (ref 4–13)
BUN/CREA RATIO: 23 — ABNORMAL HIGH (ref 6–22)
BUN: 15 mg/dL (ref 7–25)
CALCIUM: 9.6 mg/dL (ref 8.6–10.3)
CHLORIDE: 106 mmol/L (ref 98–107)
CO2 TOTAL: 27 mmol/L (ref 21–31)
CREATININE: 0.65 mg/dL (ref 0.60–1.30)
ESTIMATED GFR: 92 mL/min/{1.73_m2} (ref >59)
GLUCOSE: 85 mg/dL (ref 74–109)
OSMOLALITY, CALCULATED: 280 mOsm/kg (ref 270–290)
POTASSIUM: 4 mmol/L (ref 3.5–5.1)
SODIUM: 140 mmol/L (ref 136–145)

## 2022-05-19 LAB — HEPATIC FUNCTION PANEL
ALBUMIN/GLOBULIN RATIO: 1.9 — ABNORMAL HIGH (ref 0.8–1.4)
ALBUMIN: 4.6 g/dL (ref 3.5–5.7)
ALKALINE PHOSPHATASE: 67 U/L (ref 34–104)
ALT (SGPT): 18 U/L (ref 7–52)
AST (SGOT): 26 U/L (ref 13–39)
BILIRUBIN DIRECT: 0.03 md/dL (ref <=0.20)
BILIRUBIN TOTAL: 0.4 mg/dL (ref 0.3–1.2)
BILIRUBIN, INDIRECT: 0.37 mg/dL (ref <=1)
GLOBULIN: 2.4 — ABNORMAL LOW (ref 2.9–5.4)
PROTEIN TOTAL: 7 g/dL (ref 6.4–8.9)

## 2022-05-19 LAB — THYROID STIMULATING HORMONE (SENSITIVE TSH): TSH: 1.2 u[IU]/mL (ref 0.450–5.330)

## 2022-05-19 LAB — MAGNESIUM: MAGNESIUM: 2.2 mg/dL (ref 1.9–2.7)

## 2022-06-05 ENCOUNTER — Other Ambulatory Visit: Payer: Medicare PPO | Attending: FAMILY PRACTICE

## 2022-06-05 ENCOUNTER — Other Ambulatory Visit: Payer: Self-pay

## 2022-06-05 DIAGNOSIS — D696 Thrombocytopenia, unspecified: Secondary | ICD-10-CM | POA: Insufficient documentation

## 2022-06-05 LAB — CBC WITH DIFF
BASOPHIL #: 0 10*3/uL (ref 0.00–0.10)
BASOPHIL %: 0 % (ref 0–1)
EOSINOPHIL #: 0.1 10*3/uL (ref 0.00–0.50)
EOSINOPHIL %: 1 %
HCT: 40.2 % (ref 31.2–41.9)
HGB: 13.4 g/dL (ref 10.9–14.3)
LYMPHOCYTE #: 1.8 10*3/uL (ref 1.00–3.00)
LYMPHOCYTE %: 23 % (ref 16–44)
MCH: 31.1 pg (ref 24.7–32.8)
MCHC: 33.2 g/dL (ref 32.3–35.6)
MCV: 93.6 fL (ref 75.5–95.3)
MONOCYTE #: 0.7 10*3/uL (ref 0.30–1.00)
MONOCYTE %: 9 % (ref 5–13)
MPV: 12.6 fL — ABNORMAL HIGH (ref 7.9–10.8)
NEUTROPHIL #: 5.1 10*3/uL (ref 1.85–7.80)
NEUTROPHIL %: 66 % (ref 43–77)
PLATELETS: 132 10*3/uL — ABNORMAL LOW (ref 140–440)
RBC: 4.3 10*6/uL (ref 3.63–4.92)
RDW: 13.8 % (ref 12.3–17.7)
WBC: 7.7 10*3/uL (ref 3.8–11.8)

## 2022-06-30 ENCOUNTER — Other Ambulatory Visit (HOSPITAL_COMMUNITY): Payer: Self-pay | Admitting: FAMILY PRACTICE

## 2022-06-30 DIAGNOSIS — E010 Iodine-deficiency related diffuse (endemic) goiter: Secondary | ICD-10-CM

## 2022-07-14 ENCOUNTER — Encounter (INDEPENDENT_AMBULATORY_CARE_PROVIDER_SITE_OTHER): Payer: Self-pay | Admitting: Surgery

## 2022-07-16 ENCOUNTER — Ambulatory Visit (INDEPENDENT_AMBULATORY_CARE_PROVIDER_SITE_OTHER): Payer: Self-pay | Admitting: Surgery

## 2022-07-22 NOTE — H&P (View-Only) (Signed)
GENERAL SURGERY, [facility]  [address]  [place] [address]    History and Physical     Name: Crystal Ballard MRN:  [id number]   Date: 07/23/2022 Age: 76 y.o.                Reason for Visit: Abdominal Pain    History of Present Illness  Crystal Ballard presents today persistent epigastric abdominal pain.  More tenderness than pain but does have nausea on a routine basis.  States that the nausea will increase in severity to the point where she often induces herself to vomit which almost always relieves her symptoms.  Has some underlying gastroesophageal reflux disease for which she states that she takes over-the-counter medications which controls it.    Does have history of intermittent cervical dysphagia.  Easily chokes.  No regurgitation.  States she may have had a history of esophageal dilatation in the past.    No known history of peptic ulcer disease in the past.  No prior biliary workup        MEDICAL DECISION:  Review of prior external note(s) from each unique source:  Patients referral to this office including a recent assessment by the referring provider.  This was reviewed by me for this unique office visit for the indication and intent of the referral as well as any pertinent medical or surgical history relevant to the patients independent evaluation by me today.        Patient Data  Patient History  Past Medical History:   Diagnosis Date    Depression     Diverticulitis     Hypothyroidism     Insomnia          Past Surgical History:   Procedure Laterality Date    COLONOSCOPY      HX HYSTERECTOMY      HX THYROID BIOPSY      SPINE SURGERY           Current Outpatient Medications   Medication Sig    busPIRone (BUSPAR) 10 mg Oral Tablet Take 1 Tablet (10 mg total) by mouth Twice daily    citalopram (CELEXA) 20 mg Oral Tablet Take 1 Tablet (20 mg total) by mouth Once a day    ergocalciferol, vitamin D2, (DRISDOL) 1,250 mcg (50,000 unit) Oral Capsule Take 1 Capsule  (50,000 Units total) by mouth Every 7 days    Mirtazapine (REMERON) 7.5 mg Oral Tablet Take 1 Tablet (7.5 mg total) by mouth    traMADoL (ULTRAM) 50 mg Oral Tablet SMARTSIG:1 Tablet(s) By Mouth Every 8 Hours PRN     No Known Allergies  Family Medical History:    None         Social History     Tobacco Use    Smoking status: Every Day     Types: Cigarettes    Smokeless tobacco: Never   Substance Use Topics    Alcohol use: Never    Drug use: Never            Physical Examination:  Vitals:    07/23/22 1106   BP: (!) 145/79   Pulse: 65   Temp: 36.1 C (97 F)   SpO2: 95%   Weight: 60.3 kg (133 lb)   Height: 1.626 m (5\' 4" )   BMI: 22.88      General: appropriate for age. in no acute distress.    Vital signs are present above and have been reviewed by me  HEENT: Atraumatic, Normocephalic.    Lungs: Nonlabored breathing with symmetric expansion    Heart:Regular wth respect to rate and rythmn.    Abdomen:Soft.  Mildly tender midepigastrium without palpable mass or peritoneal signs.. Nondistended     Psychiatric: Alert and oriented to person, place, and time. affect appropriate      Assessment and Plan    ICD-10-CM    1. Epigastric pain  R10.13             Discussed indications, risks, and benefits of upper endoscopy with the patient including but not limited to the possibility of polypectomy/biopsies, possible repeat examinations, bleeding, sedation risks including cardiac arrhythmias, possibility of missed diagnosis of polyp or malignancy, and remote possibilities of perforation (which may or may not require operative intervention) and death.    All questions were answered, and informed consent was clearly obtained.           I appreciate the opportunity to be involved in the care of your patients.  If you have any questions or concerns regarding this encounter, please do not hesitate to contact me at your convenience.      Maura Crandall MD MBA CPE FACS     This note may have been partially generated using MModal  Fluency Direct system, and there may be some incorrect words, spellings, and punctuation that were not noted in checking the note before saving, though effort was made to avoid such errors.

## 2022-07-22 NOTE — H&P (Unsigned)
GENERAL SURGERY, [facility] GENER[facility]  [address]  [place] [address]    History and Physical     Name: Crystal Ballard MRN:  [id number]   Date: 07/23/2022 Age: 76 y.o.                Reason for Visit: No chief complaint on file.    History of Present Illness  Ms. Crystal Ballard presents today***        MEDICAL DECISION:  Review of the result(s) of each unique test:  Patient underwent diagnostic testing ( ***) prior to this dates visit.  I have personally reviewed the results and that serves as a component of the medical decision making for this encounter       Review of prior external note(s) from each unique source:  Patients referral to this office including a recent assessment by the referring provider.  This was reviewed by me for this unique office visit for the indication and intent of the referral as well as any pertinent medical or surgical history relevant to the patients independent evaluation by me today.        Patient Data  Patient History  Past Medical History:   Diagnosis Date    Depression     Diverticulitis     Hypothyroidism     Insomnia          Past Surgical History:   Procedure Laterality Date    COLONOSCOPY      HX HYSTERECTOMY      HX THYROID BIOPSY           Current Outpatient Medications   Medication Sig    busPIRone (BUSPAR) 10 mg Oral Tablet Take 1 Tablet (10 mg total) by mouth Twice daily    citalopram (CELEXA) 20 mg Oral Tablet Take 1 Tablet (20 mg total) by mouth Once a day    ergocalciferol, vitamin D2, (DRISDOL) 1,250 mcg (50,000 unit) Oral Capsule Take 1 Capsule (50,000 Units total) by mouth Every 7 days    Mirtazapine (REMERON) 7.5 mg Oral Tablet Take 1 Tablet (7.5 mg total) by mouth    traMADoL (ULTRAM) 50 mg Oral Tablet SMARTSIG:1 Tablet(s) By Mouth Every 8 Hours PRN     Not on File  Family Medical History:    None         Social History     Tobacco Use    Smoking status: Unknown            Physical Examination:  There were no vitals filed for this  visit.   General: appropriate for age. in no acute distress.    Vital signs are present above and have been reviewed by me     HEENT: Atraumatic, Normocephalic.    Lungs: Nonlabored breathing with symmetric expansion    Heart:Regular wth respect to rate and rythmn.    Abdomen:Soft. Nontender. Nondistended     Psychiatric: Alert and oriented to person, place, and time. affect appropriate      Assessment and Plan  No diagnosis found.      ***          I appreciate the opportunity to be involved in the care of your patients.  If you have any questions or concerns regarding this encounter, please do not hesitate to contact me at your convenience.      Maura Crandall MD MBA CPE FACS     This note may have been partially generated using MModal Fluency Direct  system, and there may be some incorrect words, spellings, and punctuation that were not noted in checking the note before saving, though effort was made to avoid such errors.

## 2022-07-23 ENCOUNTER — Other Ambulatory Visit: Payer: Self-pay

## 2022-07-23 ENCOUNTER — Ambulatory Visit (INDEPENDENT_AMBULATORY_CARE_PROVIDER_SITE_OTHER): Payer: Medicare PPO | Admitting: Surgery

## 2022-07-23 ENCOUNTER — Encounter (INDEPENDENT_AMBULATORY_CARE_PROVIDER_SITE_OTHER): Payer: Self-pay | Admitting: Surgery

## 2022-07-23 VITALS — BP 145/79 | HR 65 | Temp 97.0°F | Ht 64.0 in | Wt 133.0 lb

## 2022-07-23 DIAGNOSIS — R1013 Epigastric pain: Secondary | ICD-10-CM

## 2022-08-07 ENCOUNTER — Ambulatory Visit (HOSPITAL_COMMUNITY): Payer: Medicare PPO | Admitting: Certified Registered"

## 2022-08-07 ENCOUNTER — Encounter (HOSPITAL_COMMUNITY)
Admission: RE | Disposition: A | Discharge status: Home / Self Care | Payer: Self-pay | Source: Ambulatory Visit | Attending: Surgery

## 2022-08-07 ENCOUNTER — Encounter (HOSPITAL_COMMUNITY): Payer: Medicare PPO | Admitting: Surgery

## 2022-08-07 ENCOUNTER — Inpatient Hospital Stay
Admission: RE | Admit: 2022-08-07 | Discharge: 2022-08-07 | Disposition: A | Discharge status: Home / Self Care | Payer: Medicare PPO | Source: Ambulatory Visit | Attending: Surgery | Admitting: Surgery

## 2022-08-07 ENCOUNTER — Encounter (HOSPITAL_COMMUNITY): Payer: Self-pay | Admitting: Surgery

## 2022-08-07 ENCOUNTER — Other Ambulatory Visit: Payer: Self-pay

## 2022-08-07 DIAGNOSIS — K2950 Unspecified chronic gastritis without bleeding: Secondary | ICD-10-CM | POA: Insufficient documentation

## 2022-08-07 DIAGNOSIS — J449 Chronic obstructive pulmonary disease, unspecified: Secondary | ICD-10-CM | POA: Insufficient documentation

## 2022-08-07 DIAGNOSIS — F17210 Nicotine dependence, cigarettes, uncomplicated: Secondary | ICD-10-CM | POA: Insufficient documentation

## 2022-08-07 DIAGNOSIS — I499 Cardiac arrhythmia, unspecified: Secondary | ICD-10-CM | POA: Insufficient documentation

## 2022-08-07 DIAGNOSIS — R1013 Epigastric pain: Secondary | ICD-10-CM | POA: Insufficient documentation

## 2022-08-07 DIAGNOSIS — K449 Diaphragmatic hernia without obstruction or gangrene: Secondary | ICD-10-CM | POA: Insufficient documentation

## 2022-08-07 DIAGNOSIS — E039 Hypothyroidism, unspecified: Secondary | ICD-10-CM | POA: Insufficient documentation

## 2022-08-07 SURGERY — GASTROSCOPY WITH BIOPSY
Anesthesia: General | Wound class: Clean Contaminated Wounds-The respiratory, GI, Genital, or urinary

## 2022-08-07 MED ORDER — SUCRALFATE 1 GRAM TABLET
1.0000 g | ORAL_TABLET | Freq: Two times a day (BID) | ORAL | 2 refills | Status: DC
Start: 2022-08-07 — End: 2023-04-15

## 2022-08-07 MED ORDER — OMEPRAZOLE 40 MG CAPSULE,DELAYED RELEASE
40.0000 mg | DELAYED_RELEASE_CAPSULE | Freq: Every day | ORAL | 4 refills | Status: DC
Start: 2022-08-07 — End: 2022-08-25

## 2022-08-07 MED ORDER — SODIUM CHLORIDE 0.9 % INTRAVENOUS SOLUTION
INTRAVENOUS | Status: DC
Start: 2022-08-07 — End: 2022-08-07
  Administered 2022-08-07: 0 via INTRAVENOUS

## 2022-08-07 MED ORDER — PROPOFOL 10 MG/ML IV BOLUS
INJECTION | Freq: Once | INTRAVENOUS | Status: DC | PRN
Start: 2022-08-07 — End: 2022-08-07
  Administered 2022-08-07 (×2): 50 mg via INTRAVENOUS
  Administered 2022-08-07: 100 mg via INTRAVENOUS

## 2022-08-07 MED ORDER — LIDOCAINE (PF) 100 MG/5 ML (2 %) INTRAVENOUS SYRINGE
INJECTION | Freq: Once | INTRAVENOUS | Status: DC | PRN
Start: 2022-08-07 — End: 2022-08-07
  Administered 2022-08-07: 100 mg via INTRAVENOUS

## 2022-08-07 SURGICAL SUPPLY — 2 items
FORCEPS BIOPSY MICROMESH TTH STREAMLINE CATH NEEDLE 240CM 2.4MM RJ 4 SS LRG CPC STRL DISP ORNG 2.8MM (ENDOSCOPIC SUPPLIES) ×1 IMPLANT
USE ITEM 60432 FORCEPS BIOPSY MICROMESH TTH STREAMLINE CATH NEEDLE 240CM 2.4MM RJ 4 SS LRG CPC STRL DISP ORNG 2.8MM (ENDOSCOPIC SUPPLIES) ×1 IMPLANT

## 2022-08-07 NOTE — Anesthesia Preprocedure Evaluation (Signed)
ANESTHESIA PRE-OP EVALUATION  Planned Procedure: EGD WITH BIOPSY  Review of Systems     anesthesia history negative     patient summary reviewed  nursing notes reviewed        Pulmonary   Pneumonia 1 month ago, pneumonia, COPD, mild and current smoker,   Cardiovascular    Dysrhythmias and ECG reviewed , Exercise Tolerance: > or = 4 METS        GI/Hepatic/Renal   negative GI/hepatic/renal ROS,  diverticulitis        Endo/Other    hypothyroidism,      Neuro/Psych/MS   negative neuro/psych ROS,      Cancer    negative hematology/oncology ROS,               Physical Assessment      Airway       Mallampati: II    TM distance: 3 FB    Mouth Opening: fair.            Dental           (+) upper dentures, lower dentures           Pulmonary    Comment: Pneumonia 1 month ago  Breath sounds clear to auscultation       Cardiovascular    Rhythm: regular  Rate: Normal       Other findings          Plan  ASA 3     Planned anesthesia type: general     general intravenous                    Intravenous induction       Anesthetic plan and risks discussed with patient  signed consent obtained          Patient's NPO status is appropriate for Anesthesia.

## 2022-08-07 NOTE — Discharge Instructions (Addendum)
SURGICAL DISCHARGE INSTRUCTIONS     Dr. Shirlee More, MD  performed your EGD WITH BIOPSY today at the [facility]:  Monday through Friday from 8 a.m. - 4 p.m.: [phone]    For T&D: [phone]  Between 4 p.m. - 8 a.m., weekends and holidays:  Call ER [phone]    PLEASE SEE WRITTEN HANDOUTS AS DISCUSSED BY YOUR NURSE:  EGD, Hiatal Hernia, Gastritis    ANESTHESIA INFORMATION   ANESTHESIA -- ADULT PATIENTS:  You have received intravenous sedation / general anesthesia, and you may feel drowsy and light-headed for several hours. You may even experience some forgetfulness of the procedure. DO NOT DRIVE A MOTOR VEHICLE or perform any activity requiring complete alertness or coordination until you feel fully awake in about 24-48 hours. Do not drink alcoholic beverages for at least 24 hours. Do not stay alone, you must have a responsible adult available to be with you. You may also experience a dry mouth or nausea for 24 hours. This is a normal side effect and will disappear as the effects of the medication wear off.    REMEMBER   If you experience any difficulty breathing, chest pain, bleeding that you feel is excessive, persistent nausea or vomiting or for any other concerns:  Call your physician Dr.  Shirlee More, MD   at [phone] . You may also ask to have the general doctor on call paged. They are available to you 24 hours a day.      SPECIAL INSTRUCTIONS / COMMENTS   Rest today.No driving, cooking, operating machinery or making legal decisions today.  Findings today: Moderate sized hiatal hernia, Mild Gastritis   Dr. Venia Minks has called in a prescription for your stomach.    FOLLOW-UP APPOINTMENTS   Follow up with Dr. Venia Minks in 2 weeks  August 25, 2022 09:15 AM    Dr Shirlee More

## 2022-08-07 NOTE — Anesthesia Postprocedure Evaluation (Signed)
Anesthesia Post Op Evaluation    Patient: Crystal Ballard  Procedure(s):  EGD WITH BIOPSY    Last Vitals:Temperature: 36.6 C (97.8 F) (08/07/22 0648)  Heart Rate: 67 (08/07/22 0648)  BP (Non-Invasive): 129/68 (08/07/22 7628)  Respiratory Rate: 15 (08/07/22 0648)  SpO2: 99 % (08/07/22 0648)    No notable events documented.    Patient is sufficiently recovered from the effects of anesthesia to participate in the evaluation and has returned to their pre-procedure level.  Patient location during evaluation: PACU       Patient participation: complete - patient participated  Level of consciousness: awake and alert and responsive to verbal stimuli    Pain management: adequate  Airway patency: patent    Anesthetic complications: no  Cardiovascular status: acceptable  Respiratory status: acceptable  Hydration status: acceptable  Patient post-procedure temperature: Pt Normothermic   PONV Status: Absent

## 2022-08-07 NOTE — OR Surgeon (Signed)
[facility]    Patient Name: Crystal Ballard, Crystal Ballard [facility] Number: [id number]  Date of Service: 08/07/2022   Date of Birth: 1946/05/30      Pre-Operative Diagnosis: EPIGASTIC PAIN     Post-Operative Diagnosis: moderate sized hiatal hernia, Mild Gastritis    Procedure(s)/Description:  EGD WITH BIOPSY: 43239 (CPT)     Attending Surgeon: Shirlee More, MD     Anesthesia:  CRNA: Ardelle Anton, CRNA    Anesthesia Type: .General     Specimen:  Antrum    Patient was taken to the endoscopy suite.  Given appropriate intravenous sedation.  Video gastroscope inserted into the posterior pharynx and directed distally into the esophagus.  Esophagus was transversed and the stomach entered without difficulty.  The stomach was insufflated with air.  Scope was advanced to the level of the pylorus.  Pylorus was cannulated.  The first and second portion of the duodenum visualized without evidence of any abnormality.  Scope was withdrawn back into the distal stomach.  Single biopsy was taken for H. pylori at this level.  There were mild changes of superficial gastritis noted within the antrum.  The scope was withdrawn back.  The body and the fundus showed no specific abnormality.  The scope was retroflexed.  The gastroesophageal junction visualized from below.  There was a moderate-size hiatal hernia and no other specific abnormality.  Scope was then withdrawn back to the main course of the esophageal lumen.  There is no significant irregularity at the level of the squamocolumnar junction.  No signs of esophagitis.  There is no luminal constriction, or any other abnormality noted upon withdrawing back through the esophageal lumen.  Scope was then withdrawn, and this concluded the procedure patient tolerated well.    [name] MD MBA CPE FACS

## 2022-08-07 NOTE — Interval H&P Note (Signed)
[facility]      H&P UPDATE FORM                                                                                  Crystal, Ballard, 77 y.o. female  Date of Admission:  08/07/2022  Date of Birth:  June 20, 1946    08/07/2022    STOP: IF H&P IS GREATER THAN 30 DAYS FROM SURGICAL DAY COMPLETE NEW H&P IS REQUIRED.     H & P updated the day of the procedure.  1.  H&P completed within 30 days of surgical procedure and has been reviewed within 24 hours of admission but prior to surgery or a procedure requiring anesthesia services, the patient has been examined, and no change has occured in the patients condition since the H&P was completed.       Change in medications: No        No LMP recorded.      Comments:     2.  Patient continues to be appropriate candidate for planned surgical procedure. YES    Shirlee More, MD

## 2022-08-08 ENCOUNTER — Telehealth (INDEPENDENT_AMBULATORY_CARE_PROVIDER_SITE_OTHER): Payer: Self-pay | Admitting: Surgery

## 2022-08-08 DIAGNOSIS — K2950 Unspecified chronic gastritis without bleeding: Secondary | ICD-10-CM

## 2022-08-08 LAB — SURGICAL PATHOLOGY SPECIMEN

## 2022-08-23 NOTE — Progress Notes (Unsigned)
[name]    Progress Note    Name: Crystal Ballard MRN:  [id number]   Date: 08/25/2022 Age: 77 y.o.          Date of Birth:  [birth year]  PCP: Garnette Scheuermann, DO  Referring:  No ref. provider found     HPI:  Crystal Ballard is a 77 y.o. White female who returns following EGD performed by me August 07, 2022.  This showed a moderate-size hiatal hernia and mild gastritis.  Tolerated procedure without difficulty.  Was given Carafate following the procedure but she states she can not take it in the fact that she states it makes her reflux "worse. "  Currently still has some mild tenderness in the epigastrium but no other significant complaints.      Past Medical History:   Diagnosis Date    Depression     Diverticulitis     Hypothyroidism     Insomnia       Past Surgical History:   Procedure Laterality Date    COLONOSCOPY      HX HYSTERECTOMY      HX THYROID BIOPSY      SHOULDER SURGERY      SPINE SURGERY        Outpatient Medications Marked as Taking for the 08/25/22 encounter (Office Visit) with Shirlee More, MD   Medication Sig    omeprazole (PRILOSEC) 40 mg Oral Capsule, Delayed Release(E.C.) Take 1 Capsule (40 mg total) by mouth Once a day      No Known Allergies        BP 131/86   Pulse 77   Temp (!) 35.8 C (96.4 F)   Resp 18   Ht 1.626 m (5\' 4" )   Wt 62.6 kg (138 lb)   SpO2 97%   BMI 23.69 kg/m          General: appropriate for age. in no acute distress.    Vital signs are present above and have been reviewed by me     HEENT: Atraumatic, Normocephalic.    Lungs: Nonlabored breathing with symmetric expansion    Heart:Regular wth respect to rate and rythmn.    Abdomen:Soft. Nontender. Nondistended     Psychiatric: Alert and oriented to person, place, and time. affect appropriate       Assessment/Plan:  Assessment/Plan   1. Epigastric pain         Could not tolerate Carafate.  At this point going to stay on proton pump inhibitor.  No endoscopic  findings that require additional workup or intervention at this time.  I would be happy to see again at any point.      This note was partially created using voice recognition software and is inherently subject to errors including those of syntax and "sound alike " substitutions which may escape proof reading. In such instances, original meaning may be extrapolated by contextual derivation.    [name] MD MBA CPE FACS

## 2022-08-25 ENCOUNTER — Encounter (INDEPENDENT_AMBULATORY_CARE_PROVIDER_SITE_OTHER): Payer: Self-pay | Admitting: Surgery

## 2022-08-25 ENCOUNTER — Other Ambulatory Visit: Payer: Self-pay

## 2022-08-25 ENCOUNTER — Ambulatory Visit (INDEPENDENT_AMBULATORY_CARE_PROVIDER_SITE_OTHER): Payer: Medicare PPO | Admitting: Surgery

## 2022-08-25 VITALS — BP 131/86 | HR 77 | Temp 96.4°F | Resp 18 | Ht 64.0 in | Wt 138.0 lb

## 2022-08-25 DIAGNOSIS — R1013 Epigastric pain: Secondary | ICD-10-CM

## 2022-08-25 DIAGNOSIS — K2970 Gastritis, unspecified, without bleeding: Secondary | ICD-10-CM

## 2022-08-25 DIAGNOSIS — K449 Diaphragmatic hernia without obstruction or gangrene: Secondary | ICD-10-CM

## 2022-08-25 MED ORDER — OMEPRAZOLE 40 MG CAPSULE,DELAYED RELEASE
40.0000 mg | DELAYED_RELEASE_CAPSULE | Freq: Every day | ORAL | 4 refills | Status: DC
Start: 2022-08-25 — End: 2023-04-15

## 2022-09-09 ENCOUNTER — Other Ambulatory Visit: Payer: Self-pay

## 2022-09-09 ENCOUNTER — Inpatient Hospital Stay
Admission: RE | Admit: 2022-09-09 | Discharge: 2022-09-09 | Disposition: A | Discharge status: Home / Self Care | Payer: Medicare PPO | Source: Ambulatory Visit | Attending: FAMILY PRACTICE | Admitting: FAMILY PRACTICE

## 2022-09-09 DIAGNOSIS — E010 Iodine-deficiency related diffuse (endemic) goiter: Secondary | ICD-10-CM | POA: Insufficient documentation

## 2022-09-22 ENCOUNTER — Encounter (INDEPENDENT_AMBULATORY_CARE_PROVIDER_SITE_OTHER): Payer: Self-pay | Admitting: Surgery

## 2022-09-24 ENCOUNTER — Encounter (INDEPENDENT_AMBULATORY_CARE_PROVIDER_SITE_OTHER): Payer: Self-pay | Admitting: Surgery

## 2022-09-29 ENCOUNTER — Ambulatory Visit (INDEPENDENT_AMBULATORY_CARE_PROVIDER_SITE_OTHER): Payer: Medicare PPO | Admitting: Surgery

## 2022-09-29 ENCOUNTER — Encounter (INDEPENDENT_AMBULATORY_CARE_PROVIDER_SITE_OTHER): Payer: Self-pay | Admitting: Surgery

## 2022-09-29 ENCOUNTER — Other Ambulatory Visit (INDEPENDENT_AMBULATORY_CARE_PROVIDER_SITE_OTHER): Payer: Self-pay | Admitting: Surgery

## 2022-09-29 ENCOUNTER — Other Ambulatory Visit: Payer: Self-pay

## 2022-09-29 ENCOUNTER — Other Ambulatory Visit: Payer: Medicare PPO | Attending: Surgery | Admitting: Surgery

## 2022-09-29 ENCOUNTER — Inpatient Hospital Stay (INDEPENDENT_AMBULATORY_CARE_PROVIDER_SITE_OTHER)
Admission: RE | Admit: 2022-09-29 | Discharge: 2022-09-29 | Disposition: A | Discharge status: Home / Self Care | Payer: Medicare PPO | Source: Ambulatory Visit

## 2022-09-29 VITALS — BP 142/80 | HR 71 | Temp 97.0°F | Ht 64.0 in | Wt 140.0 lb

## 2022-09-29 DIAGNOSIS — E041 Nontoxic single thyroid nodule: Secondary | ICD-10-CM

## 2022-09-29 LAB — CYTOPATHOLOGY, FINE NEEDLE ASPIRATE

## 2022-09-29 NOTE — Nursing Note (Signed)
After informed signed consent obtained, preparation was made for an fine needle aspiration of thyroid nodule of concern.  The neck was prepped with Alcohol.  The aspirate was then placed on the slides directly.  This technique was repeated until adequate specimen obtained.  Patient had minimal to no bleeding, tolerated without difficulty and no complications noted.  Small bandaid dressing applied to biopsy. Arvella Nigh, LPN  [id number]

## 2022-09-30 ENCOUNTER — Encounter (INDEPENDENT_AMBULATORY_CARE_PROVIDER_SITE_OTHER): Payer: Self-pay | Admitting: Surgery

## 2022-09-30 NOTE — Progress Notes (Signed)
[facility]    Progress Note    Name: Crystal Ballard MRN:  [id number]   Date: 09/29/2022 Age: 77 y.o.          Date of Birth:  01/21/1946  PCP: Garnette Scheuermann, DO  Referring:  No ref. provider found     HPI:  Crystal Ballard is a 77 y.o. White female who returns for evaluation of thyroid nodules.  Indication for the imaging studies were reportedly thyroid abnormality on physical exam per PCP.  She has an occasional choking sensation.  No other dysphagia.  No change in her voice.  No thyroid medication.  No previous thyroid intervention.      No family history of thyroid of cancer but states multiple family members have had thyroidectomies.      Underwent a thyroid ultrasound with one T5 nodule recommending FNA.  Two other T4 nodules with recommendations based on size of nodules without the report having actual measurements noted.    Medical decision  Review of the result(s) of each unique test:  Patient underwent diagnostic testing (thyroid imaging) prior to this dates visit.  I have personally reviewed the results and that serves as a component of the medical decision making for this encounter     Review of prior external note(s) from each unique source:  Patients referral to this office including a recent assessment by the referring provider.  This was reviewed by me for this unique office visit for the indication and intent of the referral as well as any pertinent medical or surgical history relevant to the patients independent evaluation by me today.          Past Medical History:   Diagnosis Date    Anxiety     Depression     Diverticulitis     Hiatal hernia     Hypothyroidism     Insomnia       Past Surgical History:   Procedure Laterality Date    COLONOSCOPY      HX HYSTERECTOMY      SHOULDER SURGERY      SPINE SURGERY        Outpatient Medications Marked as Taking for the 09/29/22 encounter (Office Visit) with Shirlee More, MD   Medication Sig     busPIRone (BUSPAR) 10 mg Oral Tablet Take 1 Tablet (10 mg total) by mouth Twice daily    citalopram (CELEXA) 20 mg Oral Tablet Take 1 Tablet (20 mg total) by mouth Once a day    Mirtazapine (REMERON) 7.5 mg Oral Tablet Take 1 Tablet (7.5 mg total) by mouth    sucralfate (CARAFATE) 1 gram Oral Tablet Take 1 Tablet (1 g total) by mouth Twice a day before meals      No Known Allergies        BP (!) 142/80   Pulse 71   Temp 36.1 C (97 F)   Ht 1.626 m ('5\' 4"'$ )   Wt 63.5 kg (140 lb)   SpO2 96%   BMI 24.03 kg/m          General: appropriate for age. in no acute distress.    Vital signs are present above and have been reviewed by me     HEENT: Atraumatic, Normocephalic. Thyroid with mild nodularity    Lungs: Nonlabored breathing with symmetric expansion    Heart:Regular wth respect to rate and rythmn.    Abdomen:Soft. Nontender. Nondistended  Psychiatric: Alert and oriented to person, place, and time. affect appropriate       Assessment/Plan:  Assessment/Plan   1. Thyroid nodule         FNA of the T5 nodule accomplished without problem.    Reviewed the imaging.  The two T4 nodules are very small and can be observed based on current recommendations.        This note was partially created using voice recognition software and is inherently subject to errors including those of syntax and "sound alike " substitutions which may escape proof reading. In such instances, original meaning may be extrapolated by contextual derivation.    [name] MD MBA CPE FACS

## 2022-09-30 NOTE — Procedures (Signed)
GENERAL SURGERY, [place] [address]  [place] [address]    Procedure Note    Name: Crystal Ballard MRN:  [id number]   Date: 09/29/2022 Age: 77 y.o.  DOB:   November 15, 1945       FNA BX W US;UP TO 10 LESIONS (AMB ONLY)    Performed by: Shirlee More, MD  Authorized by: Shirlee More, MD    Time Out:      Immediately before the procedure, a time out was called:  Yes      Patient verified:  Yes      Procedure verified:  Yes      Site verified:  Yes  Procedure Details:      Number of lesions:  1       Documentation:  Area of concern was noted on the above ultrasound description.  Skin was prepped with alcohol.  Under direct ultrasound guidance 27 gauge needle was used to pass into the thyroid nodule without difficulty.  Multiple passes taken with the aspirate transferred to slides.  One aspirate collected for Afirma evaluation.  Tolerated with no difficulty.       Shirlee More, MD

## 2023-01-15 ENCOUNTER — Other Ambulatory Visit (HOSPITAL_COMMUNITY): Payer: Self-pay | Admitting: FAMILY PRACTICE

## 2023-01-15 ENCOUNTER — Encounter (HOSPITAL_COMMUNITY): Payer: Self-pay | Admitting: FAMILY PRACTICE

## 2023-01-15 DIAGNOSIS — R109 Unspecified abdominal pain: Secondary | ICD-10-CM

## 2023-01-15 DIAGNOSIS — R198 Other specified symptoms and signs involving the digestive system and abdomen: Secondary | ICD-10-CM

## 2023-01-15 DIAGNOSIS — K5900 Constipation, unspecified: Secondary | ICD-10-CM

## 2023-04-01 ENCOUNTER — Ambulatory Visit (INDEPENDENT_AMBULATORY_CARE_PROVIDER_SITE_OTHER): Payer: Self-pay

## 2023-04-15 ENCOUNTER — Other Ambulatory Visit: Payer: Self-pay

## 2023-04-15 ENCOUNTER — Ambulatory Visit (INDEPENDENT_AMBULATORY_CARE_PROVIDER_SITE_OTHER): Payer: Commercial Managed Care - PPO

## 2023-04-15 ENCOUNTER — Encounter (INDEPENDENT_AMBULATORY_CARE_PROVIDER_SITE_OTHER): Payer: Self-pay

## 2023-04-15 VITALS — BP 145/81 | HR 76 | Temp 98.3°F | Resp 18 | Ht 64.0 in | Wt 140.8 lb

## 2023-04-15 DIAGNOSIS — S2239XA Fracture of one rib, unspecified side, initial encounter for closed fracture: Secondary | ICD-10-CM

## 2023-04-15 DIAGNOSIS — R030 Elevated blood-pressure reading, without diagnosis of hypertension: Secondary | ICD-10-CM

## 2023-04-15 DIAGNOSIS — E039 Hypothyroidism, unspecified: Secondary | ICD-10-CM | POA: Insufficient documentation

## 2023-04-15 DIAGNOSIS — X58XXXA Exposure to other specified factors, initial encounter: Secondary | ICD-10-CM

## 2023-04-15 DIAGNOSIS — F418 Other specified anxiety disorders: Secondary | ICD-10-CM

## 2023-04-15 DIAGNOSIS — E785 Hyperlipidemia, unspecified: Secondary | ICD-10-CM

## 2023-04-15 DIAGNOSIS — G4700 Insomnia, unspecified: Secondary | ICD-10-CM

## 2023-04-15 DIAGNOSIS — Z0000 Encounter for general adult medical examination without abnormal findings: Secondary | ICD-10-CM

## 2023-04-15 MED ORDER — MIRTAZAPINE 7.5 MG TABLET
7.5000 mg | ORAL_TABLET | Freq: Every evening | ORAL | 1 refills | Status: DC
Start: 2023-04-15 — End: 2023-10-16

## 2023-04-15 MED ORDER — OMEPRAZOLE 40 MG CAPSULE,DELAYED RELEASE
40.0000 mg | DELAYED_RELEASE_CAPSULE | Freq: Every day | ORAL | 4 refills | Status: DC
Start: 2023-04-15 — End: 2023-10-23

## 2023-04-15 MED ORDER — BUSPIRONE 10 MG TABLET
10.0000 mg | ORAL_TABLET | Freq: Two times a day (BID) | ORAL | 1 refills | Status: DC
Start: 2023-04-15 — End: 2023-07-21

## 2023-04-15 MED ORDER — CITALOPRAM 20 MG TABLET
20.0000 mg | ORAL_TABLET | Freq: Every day | ORAL | 1 refills | Status: DC
Start: 2023-04-15 — End: 2023-10-16

## 2023-04-15 MED ORDER — ERGOCALCIFEROL (VITAMIN D2) 1,250 MCG (50,000 UNIT) CAPSULE
50000.0000 [IU] | ORAL_CAPSULE | ORAL | 1 refills | Status: DC
Start: 2023-04-15 — End: 2023-10-23
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
## 2023-04-15 NOTE — Assessment & Plan Note (Signed)
Lab Results   Component Value Date    TSH 1.200 05/19/2022   Thyroid prescriptions were brief and many years ago. Patient had self-discontinued her synthroid. Last TSH WNL. Repeat TSH ordered today.

## 2023-04-15 NOTE — Assessment & Plan Note (Signed)
Remeron 7.5 mg QHS. Patient endorses effectiveness and denies side effects.

## 2023-04-15 NOTE — Progress Notes (Signed)
PRN MEDICAL OFFICE [facility]  FAMILY MEDICINE, MEDICAL OFFICE BUILDING  [address]  [place] [address]  [phone]   FAINA BOWLBY  Dec 27, 1945  [id number]    Date of Service: 04/15/2023  Chief complaint:   Chief Complaint   Patient presents with    New Patient     Left breast pain x3 weeks     Subjective:     Crystal Ballard is a 77 y.o. female and presents today as a new patient to establish care, baseline lab values and medication refills. Her last provider is leaving the area. She denies being established with any specialists. She is currently complaining of left chest pain after she hit it on the ladder while painting 3 weeks ago. Denies any other acute medical complaints that would require intervention.    Past Medical History:   Diagnosis Date    Anxiety with depression     Diverticulitis     Hiatal hernia     Hypothyroidism     Insomnia      Past Surgical History:   Procedure Laterality Date    COLONOSCOPY      HX HYSTERECTOMY      HX WISDOM TEETH EXTRACTION      SHOULDER SURGERY      SPINE SURGERY       Current Outpatient Medications   Medication Sig Dispense Refill    busPIRone (BUSPAR) 10 mg Oral Tablet Take 1 Tablet (10 mg total) by mouth Twice daily 90 Tablet 1    citalopram (CELEXA) 20 mg Oral Tablet Take 1 Tablet (20 mg total) by mouth Once a day 90 Tablet 1    ergocalciferol, vitamin D2, (DRISDOL) 1,250 mcg (50,000 unit) Oral Capsule Take 1 Capsule (50,000 Units total) by mouth Every 7 days 90 Capsule 1    Mirtazapine (REMERON) 7.5 mg Oral Tablet Take 1 Tablet (7.5 mg total) by mouth Every night 90 Tablet 1    omeprazole (PRILOSEC) 40 mg Oral Capsule, Delayed Release(E.C.) Take 1 Capsule (40 mg total) by mouth Once a day 90 Capsule 4     No current facility-administered medications for this visit.     Allergies   Allergen Reactions    Pravastatin Myalgia and  Other Adverse Reaction (Add comment)     Foot pain at 20mg      Review of Systems  Any pertinent Review of Systems as  addressed in the HPI above.    Objective:     BP (!) 145/81   Pulse 76   Temp 36.8 C (98.3 F) (Temporal)   Resp 18   Ht 1.626 m (5\' 4" )   Wt 63.9 kg (140 lb 12.8 oz)   SpO2 94%   BMI 24.17 kg/m      Physical Exam  Vitals reviewed.   Constitutional:       General: She is not in acute distress.     Appearance: Normal appearance.   HENT:      Head: Normocephalic.      Nose: Nose normal.      Mouth/Throat:      Mouth: Mucous membranes are moist.   Eyes:      Pupils: Pupils are equal, round, and reactive to light.   Cardiovascular:      Rate and Rhythm: Normal rate and regular rhythm.      Pulses: Normal pulses.      Heart sounds: Normal heart sounds.   Pulmonary:      Effort: Pulmonary effort is  normal.      Breath sounds: Normal breath sounds.   Chest:       Abdominal:      General: Bowel sounds are normal.      Palpations: Abdomen is soft.   Skin:     General: Skin is warm and dry.      Capillary Refill: Capillary refill takes less than 2 seconds.   Neurological:      General: No focal deficit present.      Mental Status: She is alert and oriented to person, place, and time. Mental status is at baseline.   Psychiatric:         Attention and Perception: Attention normal.         Mood and Affect: Mood and affect normal.         Speech: Speech normal.         Behavior: Behavior normal. Behavior is cooperative.         Thought Content: Thought content normal.       Laboratory:  COMPLETE BLOOD COUNT   Lab Results   Component Value Date    WBC 7.7 06/05/2022    HGB 13.4 06/05/2022    HCT 40.2 06/05/2022    PLTCNT 132 (L) 06/05/2022     DIFFERENTIAL  Lab Results   Component Value Date    PMNS 66 06/05/2022    LYMPHOCYTES 23 06/05/2022    MONOCYTES 9 06/05/2022    EOSINOPHIL 1 06/05/2022    BASOPHILS 0 06/05/2022    BASOPHILS 0.00 06/05/2022    PMNABS 5.10 06/05/2022    LYMPHSABS 1.80 06/05/2022    EOSABS 0.10 06/05/2022    MONOSABS 0.70 06/05/2022     BASIC METABOLIC PANEL  Lab Results   Component Value Date     SODIUM 140 05/19/2022    POTASSIUM 4.0 05/19/2022    CHLORIDE 106 05/19/2022    CO2 27 05/19/2022    ANIONGAP 7 05/19/2022    BUN 15 05/19/2022    CREATININE 0.65 05/19/2022    BUNCRRATIO 23 (H) 05/19/2022    GFR 92 05/19/2022    CALCIUM 9.6 05/19/2022    GLUCOSENF 85 05/19/2022      LIPID PROFILE  Lab Results   Component Value Date    CHOLESTEROL 214 (H) 05/19/2022    HDLCHOL 74 05/19/2022    LDLCHOL 116 (H) 05/19/2022    TRIG 121 05/19/2022     THYROID STIMULATING HORMONE  Lab Results   Component Value Date    TSH 1.200 05/19/2022   Most recent lab results reviewed with patient.  Assessment & Plan  Hyperlipidemia, unspecified hyperlipidemia type  Lab Results   Component Value Date    CHOLESTEROL 214 (H) 05/19/2022    HDLCHOL 74 05/19/2022    LDLCHOL 116 (H) 05/19/2022    TRIG 121 05/19/2022   States that she was briefly on a statin, but is currently managing hyperlipidemia through dietary modification.   Insomnia, unspecified type  Remeron 7.5 mg QHS. Patient endorses effectiveness and denies side effects.   Hypothyroidism, unspecified type  Lab Results   Component Value Date    TSH 1.200 05/19/2022   Thyroid prescriptions were brief and many years ago. Patient had self-discontinued her synthroid. Last TSH WNL. Repeat TSH ordered today.   Anxiety with depression  Buspar 10 mg daily, Celexa 20 mg daily. Endorses effectiveness and denies breakthrough symptoms.   Rib fracture  Physical examination revealed tenderness to chest beneath left breast s/p hitting it on  ladder while painting 3 weeks prior. Patient refused Xray imaging. She is capable of deep breathing and refuses analgesics and rest, stating that she has more work to do.     I reviewed the patient's past medical, surgical, social and family histories. Immunizations and allergies were reviewed and discussed. Counseling with regards to preventative care given. Medication summary was discussed with the patient to verify compliance and understanding. Medication  safety was discussed. A good faith effort was made to reconcile the patient's medications. Refill orders were placed on all active medications. Lab orders were placed for BMP, CBC, Hepatic function panel, Lipid panel and TSH to establish a baseline in a new patient. The patient was given the opportunity to ask questions and those questions were answered to the patient's satisfaction. The patient was encouraged to call with any additional questions or concerns. Instructed patient to call back with onset of new symptoms or if current symptoms persist or worsen. More than 50% of the visit was spent counseling and coordinating care.    Plan:  Orders Placed This Encounter    BASIC METABOLIC PANEL    CBC/DIFF    HEPATIC FUNCTION PANEL    LIPID PANEL    THYROID STIMULATING HORMONE WITH FREE T4 REFLEX    busPIRone (BUSPAR) 10 mg Oral Tablet    citalopram (CELEXA) 20 mg Oral Tablet    ergocalciferol, vitamin D2, (DRISDOL) 1,250 mcg (50,000 unit) Oral Capsule    Mirtazapine (REMERON) 7.5 mg Oral Tablet    omeprazole (PRILOSEC) 40 mg Oral Capsule, Delayed Release(E.C.)     Return in about 3 months (around 07/15/2023).  Ignacia Marvel, APRN,NP-C 04/15/2023, 11:36

## 2023-04-15 NOTE — Assessment & Plan Note (Signed)
Lab Results   Component Value Date    CHOLESTEROL 214 (H) 05/19/2022    HDLCHOL 74 05/19/2022    LDLCHOL 116 (H) 05/19/2022    TRIG 121 05/19/2022   States that she was briefly on a statin, but is currently managing hyperlipidemia through dietary modification.

## 2023-04-15 NOTE — Assessment & Plan Note (Signed)
Buspar 10 mg daily, Celexa 20 mg daily. Endorses effectiveness and denies breakthrough symptoms.

## 2023-07-15 ENCOUNTER — Encounter (INDEPENDENT_AMBULATORY_CARE_PROVIDER_SITE_OTHER): Payer: Self-pay

## 2023-07-15 ENCOUNTER — Ambulatory Visit (INDEPENDENT_AMBULATORY_CARE_PROVIDER_SITE_OTHER): Payer: Commercial Managed Care - PPO

## 2023-07-15 ENCOUNTER — Other Ambulatory Visit: Payer: Commercial Managed Care - PPO

## 2023-07-15 ENCOUNTER — Other Ambulatory Visit: Payer: Self-pay

## 2023-07-15 VITALS — BP 129/70 | HR 73 | Temp 97.9°F | Resp 16 | Ht 62.5 in | Wt 136.8 lb

## 2023-07-15 DIAGNOSIS — M51369 Other intervertebral disc degeneration, lumbar region without mention of lumbar back pain or lower extremity pain: Secondary | ICD-10-CM

## 2023-07-15 DIAGNOSIS — Z87891 Personal history of nicotine dependence: Secondary | ICD-10-CM

## 2023-07-15 DIAGNOSIS — E785 Hyperlipidemia, unspecified: Secondary | ICD-10-CM

## 2023-07-15 DIAGNOSIS — Z13820 Encounter for screening for osteoporosis: Secondary | ICD-10-CM

## 2023-07-15 DIAGNOSIS — Z1159 Encounter for screening for other viral diseases: Secondary | ICD-10-CM

## 2023-07-15 DIAGNOSIS — Z0000 Encounter for general adult medical examination without abnormal findings: Secondary | ICD-10-CM

## 2023-07-15 DIAGNOSIS — E039 Hypothyroidism, unspecified: Secondary | ICD-10-CM | POA: Insufficient documentation

## 2023-07-15 DIAGNOSIS — D696 Thrombocytopenia, unspecified: Secondary | ICD-10-CM | POA: Insufficient documentation

## 2023-07-15 DIAGNOSIS — Z780 Asymptomatic menopausal state: Secondary | ICD-10-CM

## 2023-07-15 DIAGNOSIS — F418 Other specified anxiety disorders: Secondary | ICD-10-CM

## 2023-07-15 DIAGNOSIS — Z7189 Other specified counseling: Secondary | ICD-10-CM

## 2023-07-15 DIAGNOSIS — Z9181 History of falling: Secondary | ICD-10-CM

## 2023-07-15 DIAGNOSIS — Z23 Encounter for immunization: Secondary | ICD-10-CM

## 2023-07-15 DIAGNOSIS — Z9189 Other specified personal risk factors, not elsewhere classified: Secondary | ICD-10-CM

## 2023-07-15 LAB — CBC WITH DIFF
BASOPHIL #: 0 10*3/uL (ref 0.00–0.10)
BASOPHIL %: 0 % (ref 0–1)
EOSINOPHIL #: 0 10*3/uL (ref 0.00–0.50)
EOSINOPHIL %: 1 % (ref 1–7)
HCT: 39.2 % (ref 31.2–41.9)
HGB: 13.2 g/dL (ref 10.9–14.3)
LYMPHOCYTE #: 1.7 10*3/uL (ref 1.00–3.00)
LYMPHOCYTE %: 26 % (ref 16–44)
MCH: 31.2 pg (ref 24.7–32.8)
MCHC: 33.6 g/dL (ref 32.3–35.6)
MCV: 92.7 fL (ref 75.5–95.3)
MONOCYTE #: 0.6 10*3/uL (ref 0.30–1.00)
MONOCYTE %: 10 % (ref 5–13)
MPV: 12.8 fL — ABNORMAL HIGH (ref 7.9–10.8)
NEUTROPHIL #: 4.2 10*3/uL (ref 1.85–7.80)
NEUTROPHIL %: 64 % (ref 43–77)
PLATELETS: 107 10*3/uL — ABNORMAL LOW (ref 140–440)
RBC: 4.23 10*6/uL (ref 3.63–4.92)
RDW: 13.5 % (ref 12.3–17.7)
WBC: 6.6 10*3/uL (ref 3.8–11.8)

## 2023-07-15 LAB — HEPATIC FUNCTION PANEL
ALBUMIN/GLOBULIN RATIO: 1.8 — ABNORMAL HIGH (ref 0.8–1.4)
ALBUMIN: 4.2 g/dL (ref 3.5–5.7)
ALKALINE PHOSPHATASE: 62 U/L (ref 34–104)
ALT (SGPT): 10 U/L (ref 7–52)
AST (SGOT): 16 U/L (ref 13–39)
BILIRUBIN DIRECT: <0.1 md/dL (ref 0.03–0.18)
BILIRUBIN TOTAL: 0.4 mg/dL (ref 0.3–1.0)
BILIRUBIN, INDIRECT: >0.3 mg/dL (ref <=1)
GLOBULIN: 2.3 (ref 2.0–3.5)
PROTEIN TOTAL: 6.5 g/dL (ref 6.4–8.9)

## 2023-07-15 LAB — BASIC METABOLIC PANEL
ANION GAP: 4 mmol/L (ref 4–13)
BUN/CREA RATIO: 18 (ref 6–22)
BUN: 13 mg/dL (ref 7–25)
CALCIUM: 8.9 mg/dL (ref 8.6–10.3)
CHLORIDE: 107 mmol/L (ref 98–107)
CO2 TOTAL: 30 mmol/L (ref 21–31)
CREATININE: 0.73 mg/dL (ref 0.60–1.30)
ESTIMATED GFR: 85 mL/min/{1.73_m2} (ref >59)
GLUCOSE: 62 mg/dL — ABNORMAL LOW (ref 74–109)
OSMOLALITY, CALCULATED: 279 mosm/kg (ref 270–290)
POTASSIUM: 3.9 mmol/L (ref 3.5–5.1)
SODIUM: 141 mmol/L (ref 136–145)

## 2023-07-15 LAB — LIPID PANEL
CHOL/HDL RATIO: 3.2
CHOLESTEROL: 213 mg/dL — ABNORMAL HIGH (ref <200)
HDL CHOL: 66 mg/dL (ref >=40)
LDL CALC: 124 mg/dL — ABNORMAL HIGH (ref 0–100)
TRIGLYCERIDES: 116 mg/dL (ref <=150)
VLDL CALC: 23 mg/dL (ref 0–50)

## 2023-07-15 LAB — SCAN DIFFERENTIAL
PLATELET MORPHOLOGY COMMENT: ADEQUATE
RBC MORPHOLOGY COMMENT: NORMAL

## 2023-07-15 LAB — THYROID STIMULATING HORMONE WITH FREE T4 REFLEX: TSH: 1.009 u[IU]/mL (ref 0.450–5.330)

## 2023-07-15 MED ORDER — BOOSTRIX TDAP 2.5 LF UNIT-8 MCG-5 LF/0.5 ML INTRAMUSCULAR SYRINGE
0.5000 mL | INJECTION | Freq: Once | INTRAMUSCULAR | 0 refills | Status: AC
Start: 2023-07-15 — End: 2023-07-15

## 2023-07-15 MED ORDER — DEXAMETHASONE SODIUM PHOSPHATE 4 MG/ML INJECTION SOLUTION
4.0000 mg | INTRAMUSCULAR | Status: AC
Start: 2023-07-15 — End: 2023-07-15
  Administered 2023-07-15: 4 mg via INTRAMUSCULAR

## 2023-07-15 MED ORDER — CELECOXIB 100 MG CAPSULE
100.0000 mg | ORAL_CAPSULE | Freq: Two times a day (BID) | ORAL | 1 refills | Status: DC
Start: 2023-07-15 — End: 2023-10-23

## 2023-07-15 NOTE — Nursing Note (Signed)
07/15/23 1310   Medicare Wellness Assessment   Medicare initial or wellness physical in the last year? Yes   Advance Directives   Does patient have a living will or MPOA No   Advance directive information given to the patient today? Patient Declined   Activities of Daily Living   Do you need help with dressing, bathing, or walking? No   Do you need help with shopping, housekeeping, medications, or finances? No   Do you have rugs in hallways, broken steps, or poor lighting? No   Do you have grab bars in your bathroom, non-slip strips in your tub, and hand rails on your stairs? Yes   Cognitive Function Screen   What is you age? 1   What is the time to the nearest hour? 1   Remember this address: [address]   What is the year? 1   What is the name of this clinic? 1   Can the patient recognize two persons (the doctor, the nurse, home help, etc.)? 1   What is the date of your birth? (day and month sufficient)  1   In what year did World War II end? 0   Who is the current president of the [place]? 1   Count from 20 down to 1? 1   What address did I give you earlier? 1   Total Score 9   Interpretation of Total Score Greater than 6 Normal   Depression Screen   Little interest or pleasure in doing things. 0   Feeling down, depressed, or hopeless 1   PHQ 2 Total 1   Pain Score   Pain Score EIGHT   Substance Use Screening   In Past 12 MONTHS, how often have you used any tobacco product (for example, cigarettes, e-cigarettes, cigars, pipes, or smokeless tobacco)? Daily   In the PAST 3 MONTHS, did you smoke a cigarette containing tobacco or use any other nicotine delivery product (i.e., e-cigarette, vaping or chewing tobacco)? Yes   In the PAST 3 MONTHS, did you usually smoke more than 10 cigarettes, vape, use an e-cigarette or chew tobacco more than 10 times each day? No   In the PAST 3 MONTHS, did you usually smoke/use an e-cigarette, vape or chew tobacco within 30 minutes after waking? Yes   In the  PAST 12 MONTHS, how often have you had 5 (men)/4 (women) or more drinks containing alcohol in one day? Never   In the PAST 12 months, how often have you used any prescription medications just for the feeling, more than prescribed, or that were not prescribed for you? Prescriptions may include: opioids, benzodiazepines, medications for ADHD Never   In the PAST 12 MONTHS, how often have you used any drugs, including marijuana, cocaine or crack, heroin, methamphetamine, hallucinogens, ecstasy/MDMA? Never   Hearing Screen   Have you noticed any hearing difficulties? Yes   After whispering 9-1-6 how many numbers did the patient repeat correctly? 3   Fall Risk Assessment   Do you feel unsteady when standing or walking? Yes   Do you worry about falling? Yes   Have you fallen in the past year? Yes   How many times have you fallen? Once   Were you ever injured from falling? No   Urinary Incontinence Screen   Do you ever leak urine when you don't want to? No   OTHER   Reported to Encounter Provider Yes

## 2023-07-15 NOTE — Assessment & Plan Note (Addendum)
Lab Results   Component Value Date    CHOLESTEROL 214 (H) 05/19/2022    HDLCHOL 74 05/19/2022    LDLCHOL 116 (H) 05/19/2022    TRIG 121 05/19/2022    Patient advised on the benefits associated with statin therapy in addition to lifestyle modifications. Repeat lipid panel ordered.

## 2023-07-15 NOTE — Assessment & Plan Note (Addendum)
THYROID STIMULATING HORMONE  Lab Results   Component Value Date    TSH 1.200 05/19/2022   TSH WNL, Repeat serum TSH ordered.

## 2023-07-15 NOTE — Progress Notes (Signed)
PRN MEDICAL OFFICE [facility]  FAMILY MEDICINE, MEDICAL OFFICE BUILDING  [address]  [place] [address]  [phone]   Crystal Ballard  1945-11-13  [id number]    Date of Service: 07/15/2023  Chief complaint:   Chief Complaint   Patient presents with    Follow Up 3 Months     Subjective:   Crystal Ballard is a 77 y.o. female and presents today for follow-up on chronic disease management, and lab work. Patient complaining of "trigger finger" she states that this has been a chronic issue x 2 years and was effectively treated with corticosteroids in the past. Bilateral knee pain, Lower back pain, Bilateral feet pain. All chronic, but patient verbalizes acute exacerbation x 1 month without inciting event.     Past Medical History:   Diagnosis Date    Anxiety with depression     Diverticulitis     Hiatal hernia     Hypothyroidism     Insomnia      Past Surgical History:   Procedure Laterality Date    COLONOSCOPY      HX HYSTERECTOMY      HX WISDOM TEETH EXTRACTION      SHOULDER SURGERY      SPINE SURGERY       Current Outpatient Medications   Medication Sig Dispense Refill    busPIRone (BUSPAR) 10 mg Oral Tablet Take 1 Tablet (10 mg total) by mouth Twice daily 90 Tablet 1    celecoxib (CELEBREX) 100 mg Oral Capsule Take 1 Capsule (100 mg total) by mouth Twice daily 180 Capsule 1    citalopram (CELEXA) 20 mg Oral Tablet Take 1 Tablet (20 mg total) by mouth Once a day 90 Tablet 1    diphtheria,pertussis-acell,tetanus (BOOSTRIX TDAP) 2.5-8.5Lf-mcg-Lf/0.32mL IM suspension Inject 0.5 mL into the muscle One time for 1 dose 0.5 mL 0    ergocalciferol, vitamin D2, (DRISDOL) 1,250 mcg (50,000 unit) Oral Capsule Take 1 Capsule (50,000 Units total) by mouth Every 7 days 90 Capsule 1    Mirtazapine (REMERON) 7.5 mg Oral Tablet Take 1 Tablet (7.5 mg total) by mouth Every night 90 Tablet 1    omeprazole (PRILOSEC) 40 mg Oral Capsule, Delayed Release(E.C.) Take 1 Capsule (40 mg total) by mouth Once a day 90 Capsule  4     No current facility-administered medications for this visit.     Allergies   Allergen Reactions    Pravastatin Myalgia and  Other Adverse Reaction (Add comment)     Foot pain at 20mg      Review of Systems:  Any pertinent Review of Systems as addressed in the HPI above.    Objective:   BP 129/70   Pulse 73   Temp 36.6 C (97.9 F) (Temporal)   Resp 16   Ht 1.588 m (5' 2.5")   Wt 62.1 kg (136 lb 12.8 oz)   SpO2 96%   BMI 24.62 kg/m      Physical Exam  Vitals reviewed.   Constitutional:       General: She is not in acute distress.     Appearance: Normal appearance. She is normal weight.   HENT:      Mouth/Throat:      Mouth: Mucous membranes are moist.   Eyes:      Pupils: Pupils are equal, round, and reactive to light.   Cardiovascular:      Rate and Rhythm: Normal rate and regular rhythm.      Pulses: Normal  pulses.      Heart sounds: Murmur heard.   Pulmonary:      Effort: Pulmonary effort is normal.      Breath sounds: Normal breath sounds.   Abdominal:      General: Bowel sounds are normal.      Palpations: Abdomen is soft.   Skin:     General: Skin is warm and dry.      Capillary Refill: Capillary refill takes less than 2 seconds.   Neurological:      General: No focal deficit present.      Mental Status: She is alert and oriented to person, place, and time. Mental status is at baseline.   Psychiatric:         Attention and Perception: Attention normal.         Mood and Affect: Mood and affect normal.         Speech: Speech normal.         Behavior: Behavior normal. Behavior is cooperative.         Thought Content: Thought content normal.       Laboratory:  COMPLETE BLOOD COUNT   Lab Results   Component Value Date    WBC 7.7 06/05/2022    HGB 13.4 06/05/2022    HCT 40.2 06/05/2022    PLTCNT 132 (L) 06/05/2022     DIFFERENTIAL  Lab Results   Component Value Date    PMNS 66 06/05/2022    LYMPHOCYTES 23 06/05/2022    MONOCYTES 9 06/05/2022    EOSINOPHIL 1 06/05/2022    BASOPHILS 0 06/05/2022     BASOPHILS 0.00 06/05/2022    PMNABS 5.10 06/05/2022    LYMPHSABS 1.80 06/05/2022    EOSABS 0.10 06/05/2022    MONOSABS 0.70 06/05/2022     BASIC METABOLIC PANEL  Lab Results   Component Value Date    SODIUM 140 05/19/2022    POTASSIUM 4.0 05/19/2022    CHLORIDE 106 05/19/2022    CO2 27 05/19/2022    ANIONGAP 7 05/19/2022    BUN 15 05/19/2022    CREATININE 0.65 05/19/2022    BUNCRRATIO 23 (H) 05/19/2022    GFR 92 05/19/2022    CALCIUM 9.6 05/19/2022    GLUCOSENF 85 05/19/2022      LIPID PROFILE  Lab Results   Component Value Date    CHOLESTEROL 214 (H) 05/19/2022    HDLCHOL 74 05/19/2022    LDLCHOL 116 (H) 05/19/2022    TRIG 121 05/19/2022     LIVER TESTS  Lab Results   Component Value Date    ALBUMIN 4.6 05/19/2022    AST 26 05/19/2022    ALT 18 05/19/2022    ALKPHOS 67 05/19/2022    TOTBILIRUBIN 0.4 05/19/2022    BILIRUBINCON 0.03 05/19/2022     THYROID STIMULATING HORMONE  Lab Results   Component Value Date    TSH 1.200 05/19/2022   Most recent lab results reviewed with patient.  Assessment & Plan  Thrombocytopenia (CMS HCC)  COMPLETE BLOOD COUNT   Lab Results   Component Value Date    WBC 6.6 07/15/2023    HGB 13.2 07/15/2023    HCT 39.2 07/15/2023    PLTCNT 107 (L) 07/15/2023   Repeat CBC ordered today.  Hyperlipidemia, unspecified hyperlipidemia type  Lab Results   Component Value Date    CHOLESTEROL 214 (H) 05/19/2022    HDLCHOL 74 05/19/2022    LDLCHOL 116 (H) 05/19/2022    TRIG 121 05/19/2022  Patient advised on the benefits associated with statin therapy in addition to lifestyle modifications. Repeat lipid panel ordered.  Hypothyroidism, unspecified type  THYROID STIMULATING HORMONE  Lab Results   Component Value Date    TSH 1.200 05/19/2022   TSH WNL, Repeat serum TSH ordered.  DDD (degenerative disc disease), lumbar  Patient advised on treatment options for generalized chronic pain. Prescription placed for Celebrex 100 mg BID after the patient verbalized understanding and agreement with the proposed  treatment plan.   Anxiety with depression  Patient endorses compliance and effectiveness of Celexa and Buspirone regimen.    Counseling with regards to preventative care given. Medication summary was discussed with the patient to verify compliance and understanding. Medication safety was discussed. A good faith effort was made to reconcile the patient's medications. The patient was given the opportunity to ask questions and those questions were answered to the patient's satisfaction. The patient was encouraged to call with any additional questions or concerns. More than 50% of the visit was spent counseling and coordinating care.    Orders Placed This Encounter    BASIC METABOLIC PANEL    CBC/DIFF    HEPATIC FUNCTION PANEL    LIPID PANEL    THYROID STIMULATING HORMONE WITH FREE T4 REFLEX    CBC WITH DIFF    celecoxib (CELEBREX) 100 mg Oral Capsule    dexAMETHasone 4 mg/mL injection     Return in about 3 months (around 10/13/2023).  Ignacia Marvel, APRN,NP-C 07/15/2023, 14:11

## 2023-07-15 NOTE — Nursing Note (Signed)
07/15/23 1307   Comprehensive Health Assessment-Adult   Do you wish to complete this form? Yes   During the past 4 weeks, how would you rate your health in general? Fair   During the past 4 weeks, how much difficulty have you had doing your usual activities inside and outside your home because of medical or emotional problems? Much difficulty   During the past 4 weeks, was someone available to help you if you needed and wanted help? Yes, as much as I wanted   In the past year, how many times have you gone to the emergency department or been admitted to a hospital for a health problem? None   Are you generally satisfied with your sleep? Yes   Do you have enough money to buy things you need in everyday life, such as food, clothing, medicines, and housing? Yes, always   Can you get to places beyond walking distance without help?  (For example, can you drive your own car or travel alone on buses)? Yes   Do you fasten your seatbelt when you are in a car? Yes, usually   Do you exercise 20 minutes 3 or more days per week (such as walking, dancing, biking, mowing grass, swimming)? Yes, most of the time   How often do you eat food that is healthy (fruits, vegetables, lean meats) instead of unhealthy (sweets, fast food, junk food, fatty foods)? Most of the time   Have your parents, brothers or sisters had any of the following problems before the age of 73? (check all that apply) Mental health problems such as depression, bipolar, severe anxiety, postpartum depression;Cancer   How often do you have trouble taking medicines the eay you are told to take them? I always take them as prescribed   Do you need any help communicating with your doctors and nurses because of vision or hearing problems? No   During the past 12 months, have you experienced confusion or memory loss that is happening more often or is getting worse? No   Do you have one person you think of as your personal doctor (primary care provider or family doctor)?  Yes   If you are seeing a Primary Care Provider (PCP) or family doctor. please list their name Crystal Ballard   Are you now also seeing any specialist physician(s) (such as eye doctor, foot doctor, skin doctor)? No   How confident are you that you can control or manage most of your health problems? Very confident

## 2023-07-15 NOTE — Patient Instructions (Signed)
Medicare Preventive Services  Medicare coverage information Recommendation for YOU   Heart Disease and Diabetes   Lipid profile Every 5 years or more often if at risk for cardiovascular disease     Lab Results   Component Value Date    CHOLESTEROL 214 (H) 05/19/2022    HDLCHOL 74 05/19/2022    LDLCHOL 116 (H) 05/19/2022    TRIG 121 05/19/2022           Diabetes Screening    Yearly for those at risk for diabetes, 2 tests per year for those with prediabetes Last Glucose: 85    Diabetes Self Management Training or Medical Nutrition Therapy  For those with diabetes, up to 10 hrs initial training within a year, subsequent years up to 2 hrs of follow up training Optional for those with diabetes     Medical Nutrition Therapy  Three hours of one-on-one counseling in first year, two hours in subsequent years Optional for those with diabetes, kidney disease   Intensive Behavioral Therapy for Obesity  Face-to-face counseling, first month every week, month 2-6 every other week, month 7-12 every month if continued progress is documented Optional for those with Body Mass Index 30 or higher  Your Body mass index is 24.62 kg/m.   Tobacco Cessation (Quitting) Counseling   Covers up to 8 smoking and tobacco-use cessation counseling sessions in a 64-month period.    Optional for those that use tobacco   Cancer Screening Last Completion Date   Colorectal screening   For anyone age 78 to 42 or any age if high risk:  Screening Colonoscopy every 10 yrs if low risk,  more frequent if higher risk  OR  Cologuard Stool DNA test once every 3 years OR  Fecal Occult Blood Testing yearly OR  Flexible  Sigmoidoscopy  every 5 yr OR  CT Colonography every 5 yrs      See below for due date if applicable.   Screening Pap Test   Recommended every 3 years for all women age 76 to 15, or every five years if combined with HPV test (routine screening not needed after total hysterectomy).  Medicare covers every 2 years or yearly if high risk.  Screening  Pelvic Exam   Medicare covers every 2 years, yearly if high risk or childbearing age with abnormal Pap in last 3 yrs.     See below for due date if applicable.   Screening Mammogram   Recommended every 2 years for women age 24 to 5, or more frequent if you have a higher risk. Selectively recommended for women between 40-49 based on shared decisions about risk. Covered by Medicare up to every year for women age 76 or older   See below for due date if applicable.         Lung Cancer Screening  Annual low dose computed tomography (LDCT scan) is recommended for those age 76-80 who smoked 20 pack-years and are current smokers or quit smoking within past 15 years, after counseling by your doctor or nurse clinician about the possible benefits or harms.     See below for due date if applicable.   Vaccinations   Respiratory syncytial virus (RSV)  Age 76 years or older: Based on shared clinical decision-making with your provider.  Pneumococcal Vaccine  Recommended routinely age 40+ with one or two separate vaccines based on your risk. Recommended before age 40 if medical conditions with increased risk  Seasonal Influenza Vaccine  Once every flu season  Hepatitis B Vaccine  3 doses if risk (including anyone with diabetes or liver disease)  Shingles Vaccine  Two doses at age 76 or older  Diphtheria Tetanus Pertussis Vaccine  ONCE as adult, booster every 10 years     Immunization History   Administered Date(s) Administered   . Covid-19 Vaccine,Moderna,12 Years+ 10/11/2019, 11/08/2019, 06/06/2020   . FLUZONE HD VACCINE (ADMIN) 05/03/2017, 05/21/2018   . Influenza Vaccine, 65+ 04/14/2019, 05/25/2020, 05/08/2023   . PREVNAR 13 07/12/2019     Shingles vaccine and Diphtheria Tetanus Pertussis vaccines are available at pharmacies or local health department without a prescription.   Other Preventative Screening  Last Completion Date   Bone Densitometry   Screening: All females ages 1 and older every 10 years if initial screening  normal. Postmenopausal women ages 29-64 need screening with one or more risk factor: previous fracture, parental hip fracture, current smoker, low body weight, excessive alcohol use, Rheumatoid Arthritis   For women with diagnosed Osteoporosis, follow up is recommended every 2 years or a frequency recommended by your provider.       See below for due date if applicable.     Glaucoma Screening   Yearly if in high risk group such as diabetes, family history, African American age 76+  or Hispanic American age 37+   See your eye care provider for screening.   Hepatitis C Screening   Recommended  for those born between ages 18-76 years.     See below for due date if applicable.     HIV Testing  Recommended routinely at least ONCE, covered every year for age 22 to 76 regardless of risk, and every year for age over 40 who ask for the test or higher risk. Yearly or up to 3 times in pregnancy         See below for due date if applicable.   Abdominal Aortic Aneurysm Screening Ultrasound   Once with a family history of abdominal aortic aneurysms OR a female between76-75 and have smoked at least 100 cigarettes in your lifetime.         See below for due date if applicable.       Your Personalized Schedule for Preventive Tests   Health Maintenance: Pending and Last Completed       Date Due Completion Date    Osteoporosis screening Never done ---    Hepatitis C screening Never done ---    Adult Tdap-Td (1 - Tdap) Never done ---    Shingles Vaccine (1 of 2) Never done ---    CT Lung Cancer Screening Never done ---    Pneumococcal Vaccination, Age 76+  (2 of 2 - PPSV23 or PCV20) 09/06/2019 07/12/2019    RSV Adult 76+ or Pregnancy (1 - 1-dose 75+ series) Never done ---                For Information on Advanced Directives for Health Care:  [place]:  [email or web address]  [place], [place], [place], [place] General Information: [email or web address]
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
## 2023-07-15 NOTE — Progress Notes (Signed)
FAMILY MEDICINE, MEDICAL OFFICE BUILDING  [address]  [place] [address]    Medicare Annual Wellness Visit    Name: Crystal Ballard MRN:  [id number]   Date: 07/15/2023 Age: 77 y.o.     SUBJECTIVE:   Crystal Ballard is a 77 y.o. female for presenting for Medicare Wellness exam.   I have reviewed and reconciled the medication list with the patient today.        07/15/2023     1:07 PM   Comprehensive Health Assessment-Adult   Do you wish to complete this form? Yes   During the past 4 weeks, how would you rate your health in general? Fair   During the past 4 weeks, how much difficulty have you had doing your usual activities inside and outside your home because of medical or emotional problems? Much difficulty   During the past 4 weeks, was someone available to help you if you needed and wanted help? Yes, as much as I wanted   In the past year, how many times have you gone to the emergency department or been admitted to a hospital for a health problem? None   Are you generally satisfied with your sleep? Yes   Do you have enough money to buy things you need in everyday life, such as food, clothing, medicines, and housing? Yes, always   Can you get to places beyond walking distance without help?  (For example, can you drive your own car or travel alone on buses)? Yes   Do you fasten your seatbelt when you are in a car? Yes, usually   Do you exercise 20 minutes 3 or more days per week (such as walking, dancing, biking, mowing grass, swimming)? Yes, most of the time   How often do you eat food that is healthy (fruits, vegetables, lean meats) instead of unhealthy (sweets, fast food, junk food, fatty foods)? Most of the time   Have your parents, brothers or sisters had any of the following problems before the age of 18? (check all that apply) Mental health problems such as depression, bipolar, severe anxiety, postpartum depression;Cancer   How often do you have trouble taking medicines the eay you are told to  take them? I always take them as prescribed   Do you need any help communicating with your doctors and nurses because of vision or hearing problems? No   During the past 12 months, have you experienced confusion or memory loss that is happening more often or is getting worse? No   Do you have one person you think of as your personal doctor (primary care provider or family doctor)? Yes   If you are seeing a Primary Care Provider (PCP) or family doctor. please list their name Ignacia Marvel   Are you now also seeing any specialist physician(s) (such as eye doctor, foot doctor, skin doctor)? No   How confident are you that you can control or manage most of your health problems? Very confident     I have reviewed and updated as appropriate the past medical, family and social history. 07/15/2023 as summarized below:  Past Medical History:   Diagnosis Date    Anxiety with depression     Diverticulitis     Hiatal hernia     Hypothyroidism     Insomnia      Past Surgical History:   Procedure Laterality Date    Colonoscopy      Hx hysterectomy      Hx wisdom  teeth extraction      Shoulder surgery      Spine surgery       Current Outpatient Medications   Medication Sig    busPIRone (BUSPAR) 10 mg Oral Tablet Take 1 Tablet (10 mg total) by mouth Twice daily    celecoxib (CELEBREX) 100 mg Oral Capsule Take 1 Capsule (100 mg total) by mouth Twice daily    citalopram (CELEXA) 20 mg Oral Tablet Take 1 Tablet (20 mg total) by mouth Once a day    diphtheria,pertussis-acell,tetanus (BOOSTRIX TDAP) 2.5-8.5Lf-mcg-Lf/0.64mL IM suspension Inject 0.5 mL into the muscle One time for 1 dose    ergocalciferol, vitamin D2, (DRISDOL) 1,250 mcg (50,000 unit) Oral Capsule Take 1 Capsule (50,000 Units total) by mouth Every 7 days    Mirtazapine (REMERON) 7.5 mg Oral Tablet Take 1 Tablet (7.5 mg total) by mouth Every night    omeprazole (PRILOSEC) 40 mg Oral Capsule, Delayed Release(E.C.) Take 1 Capsule (40 mg total) by mouth Once a day     Family  Medical History:       Problem Relation (Age of Onset)    Cervical Cancer Mother    Heart Disease Daughter    Hypertension (High Blood Pressure) Son    No Known Problems Father          Social History     Socioeconomic History    Marital status: Divorced   Tobacco Use    Smoking status: Every Day     Current packs/day: 0.50     Average packs/day: 0.5 packs/day for 65.9 years (33.0 ttl pk-yrs)     Types: Cigarettes     Start date: 1959    Smokeless tobacco: Never   Substance and Sexual Activity    Alcohol use: Yes     Alcohol/week: 1.0 standard drink of alcohol     Types: 1 Cans of beer per week    Drug use: Never    Sexual activity: Yes     Social Determinants of Health     Financial Resource Strain: Low Risk  (01/17/2020)    Received from [facility], [facility]    Overall Financial Resource Strain (CARDIA)     Difficulty of Paying Living Expenses: Not hard at all   Transportation Needs: No Transportation Needs (01/17/2020)    Received from [facility], [facility]    PRAPARE - Transportation     Lack of Transportation (Medical): No     Lack of Transportation (Non-Medical): No    Received from [facility], [facility]    Social Network    Received from [facility], [facility]    HITS   Health Literacy: Unknown (07/23/2022)    Health Literacy     SDOH Health Literacy: Patient chooses not to answer.       List of Current Health Care Providers   Care Team       PCP       Name Type Specialty Phone Number    Ignacia Marvel, [place] Nurse Practitioner NURSE PRACTITIONER [phone]              Care Team       No care team found                      Health Maintenance   Topic Date Due    Osteoporosis screening  Never done    Hepatitis C screening  Never done    Adult Tdap-Td (1 - Tdap)  Never done    CT Lung Cancer Screening  Never done    Shingles Vaccine (1 of 2) 07/04/2024 (Originally 09/07/1996)    RSV Adult 60+ or Pregnancy (1 - 1-dose 75+ series) 07/04/2024 (Originally 09/07/2021)    Pneumococcal  Vaccination, Age 54+ (2 of 2 - PPSV23 or PCV20) 07/04/2024 (Originally 09/06/2019)    Influenza Vaccine  Completed    Medicare Annual Wellness Visit - Calendar Year Insurers  Completed    Covid-19 Vaccine  Discontinued     Medicare Wellness Assessment   Medicare initial or wellness physical in the last year?: Yes  Advance Directives   Does patient have a living will or MPOA: No           Advance directive information given to the patient today?: Patient Declined      Activities of Daily Living   Do you need help with dressing, bathing, or walking?: No   Do you need help with shopping, housekeeping, medications, or finances?: No   Do you have rugs in hallways, broken steps, or poor lighting?: No   Do you have grab bars in your bathroom, non-slip strips in your tub, and hand rails on your stairs?: Yes   Cognitive Function Screen (1=Yes, 0=No)   What is you age?: Correct   What is the time to the nearest hour?: Correct   What is the year?: Correct   What is the name of this clinic?: Correct   Can the patient recognize two persons (the doctor, the nurse, home help, etc.)?: Correct   What is the date of your birth? (day and month sufficient) : Correct   In what year did World War II end?: Incorrect   Who is the current president of the [place]?: Correct   Count from 20 down to 1?: Correct   What address did I give you earlier?: Correct   Total Score: 9   Interpretation of Total Score: Greater than 6 Normal   Fall Risk Screen   Do you feel unsteady when standing or walking?: Yes  Do you worry about falling?: Yes  Have you fallen in the past year?: Yes  How many times have you fallen?: Once  Were you ever injured from falling?: No   Depression Screen     Little interest or pleasure in doing things.: Not at all  Feeling down, depressed, or hopeless: Several Days  PHQ 2 Total: 1     Pain Score   Pain Score:   8    Substance Use-Abuse Screening     Tobacco Use     In Past 12 MONTHS, how often have you used any tobacco  product (for example, cigarettes, e-cigarettes, cigars, pipes, or smokeless tobacco)?: Daily  In the PAST 3 MONTHS, did you smoke a cigarette containing tobacco or use any other nicotine delivery product (i.e., e-cigarette, vaping or chewing tobacco)?: Yes  In the PAST 3 MONTHS, did you usually smoke more than 10 cigarettes, vape, use an e-cigarette or chew tobacco more than 10 times each day?: No  In the PAST 3 MONTHS, did you usually smoke/use an e-cigarette, vape or chew tobacco within 30 minutes after waking?: Yes     Alcohol use     In the PAST 12 MONTHS, how often have you had 5 (men)/4 (women) or more drinks containing alcohol in one day?: Never     Prescription Drug Use     In the PAST 12 months, how often have you used any prescription  medications just for the feeling, more than prescribed, or that were not prescribed for you? Prescriptions may include: opioids, benzodiazepines, medications for ADHD: Never           Illicit Drug Use   In the PAST 12 MONTHS, how often have you used any drugs, including marijuana, cocaine or crack, heroin, methamphetamine, hallucinogens, ecstasy/MDMA?: Never        Hearing Screen   Have you noticed any hearing difficulties?: Yes  After whispering 9-1-6 how many numbers did the patient repeat correctly?: 3     Vision Screen             Urine Incontinence Screen   Urinary Incontinence Screen  Do you ever leak urine when you don't want to?: No     Allergies   Allergen Reactions    Pravastatin Myalgia and  Other Adverse Reaction (Add comment)     Foot pain at 20mg      OBJECTIVE:   BP 129/70   Pulse 73   Temp 36.6 C (97.9 F) (Temporal)   Resp 16   Ht 1.588 m (5' 2.5")   Wt 62.1 kg (136 lb 12.8 oz)   SpO2 96%   BMI 24.62 kg/m     Physical Exam  Vitals reviewed.   Constitutional:       General: She is not in acute distress.     Appearance: Normal appearance. She is normal weight.   HENT:      Head: Normocephalic.      Nose: Nose normal.      Mouth/Throat:      Mouth:  Mucous membranes are moist.   Eyes:      Pupils: Pupils are equal, round, and reactive to light.   Cardiovascular:      Rate and Rhythm: Normal rate and regular rhythm.      Pulses: Normal pulses.      Heart sounds: Murmur heard.   Pulmonary:      Effort: Pulmonary effort is normal.      Breath sounds: Normal breath sounds.   Abdominal:      General: Abdomen is flat. Bowel sounds are normal.      Palpations: Abdomen is soft.   Skin:     General: Skin is warm and dry.      Capillary Refill: Capillary refill takes less than 2 seconds.   Neurological:      General: No focal deficit present.      Mental Status: She is alert and oriented to person, place, and time. Mental status is at baseline.   Psychiatric:         Attention and Perception: Attention normal.         Mood and Affect: Mood and affect normal.         Speech: Speech normal.         Behavior: Behavior normal. Behavior is cooperative.         Thought Content: Thought content normal.       Health Maintenance Due   Topic Date Due    Osteoporosis screening  Never done    Hepatitis C screening  Never done    Adult Tdap-Td (1 - Tdap) Never done    CT Lung Cancer Screening  Never done      ASSESSMENT & PLAN:   Assessment/Plan   1. Advanced care planning/counseling discussion    2. Need for hepatitis C screening test    3. Medicare annual wellness visit, subsequent  4. Need for tetanus booster    5. Need for shingles vaccine    6. Osteoporosis screening    7. Postmenopausal status    8. Risk for falls    9. At high risk for osteoporosis    10. Personal history of nicotine dependence       Identified Risk Factors/ Recommended Actions   Fall Risk Follow up plan of care: Discussed optimizing home safety    Patient declined Advanced Directives information.    Orders Placed This Encounter    DEXA BONE DENSITOMETRY    CT LUNG SCREENING LDCT    HEPATITIS C ANTIBODY SCREEN WITH REFLEX TO HCV PCR    CANCELED: CBC WITH DIFF    diphtheria,pertussis-acell,tetanus (BOOSTRIX  TDAP) 2.5-8.5Lf-mcg-Lf/0.58mL IM suspension        The patient has been educated about risk factors and recommended preventive care. Written Prevention Plan completed/ updated and given to patient (see After Visit Summary).  Return in 1 year (on 07/14/2024) for Medicare Well Visit.  Ignacia Marvel, APRN,NP-C

## 2023-07-16 NOTE — Assessment & Plan Note (Signed)
COMPLETE BLOOD COUNT   Lab Results   Component Value Date    WBC 6.6 07/15/2023    HGB 13.2 07/15/2023    HCT 39.2 07/15/2023    PLTCNT 107 (L) 07/15/2023   Repeat CBC ordered today.

## 2023-07-16 NOTE — Assessment & Plan Note (Signed)
Patient advised on treatment options for generalized chronic pain. Prescription placed for Celebrex 100 mg BID after the patient verbalized understanding and agreement with the proposed treatment plan.

## 2023-07-16 NOTE — Assessment & Plan Note (Signed)
Patient endorses compliance and effectiveness of Celexa and Buspirone regimen.

## 2023-07-17 LAB — HEPATITIS C ANTIBODY SCREEN WITH REFLEX TO HCV PCR: HCV ANTIBODY QUALITATIVE: NEGATIVE

## 2023-07-20 ENCOUNTER — Other Ambulatory Visit (INDEPENDENT_AMBULATORY_CARE_PROVIDER_SITE_OTHER): Payer: Self-pay

## 2023-07-20 MED ORDER — EZETIMIBE 10 MG TABLET
10.0000 mg | ORAL_TABLET | Freq: Every evening | ORAL | 1 refills | Status: DC
Start: 2023-07-20 — End: 2023-10-23

## 2023-07-21 ENCOUNTER — Other Ambulatory Visit (INDEPENDENT_AMBULATORY_CARE_PROVIDER_SITE_OTHER): Payer: Self-pay

## 2023-07-21 DIAGNOSIS — M6530 Trigger finger, unspecified finger: Secondary | ICD-10-CM

## 2023-08-03 ENCOUNTER — Encounter (INDEPENDENT_AMBULATORY_CARE_PROVIDER_SITE_OTHER): Payer: Self-pay

## 2023-08-03 ENCOUNTER — Ambulatory Visit (INDEPENDENT_AMBULATORY_CARE_PROVIDER_SITE_OTHER): Payer: Commercial Managed Care - PPO

## 2023-08-03 ENCOUNTER — Other Ambulatory Visit: Payer: Self-pay

## 2023-08-03 VITALS — BP 146/79 | HR 90 | Temp 98.4°F | Resp 22 | Ht 62.5 in | Wt 135.8 lb

## 2023-08-03 DIAGNOSIS — R519 Headache, unspecified: Secondary | ICD-10-CM

## 2023-08-03 DIAGNOSIS — R0989 Other specified symptoms and signs involving the circulatory and respiratory systems: Secondary | ICD-10-CM

## 2023-08-03 DIAGNOSIS — R059 Cough, unspecified: Secondary | ICD-10-CM

## 2023-08-03 LAB — POCT RAPID COVID-19 & FLU (AMB ONLY)
COVID-19 AG: NEGATIVE
INFLUENZA TYPE A: NEGATIVE
INFLUENZA TYPE B: NEGATIVE

## 2023-08-03 MED ORDER — AMOXICILLIN 875 MG TABLET
875.0000 mg | ORAL_TABLET | Freq: Two times a day (BID) | ORAL | 0 refills | Status: AC
Start: 2023-08-03 — End: 2023-08-10

## 2023-08-03 MED ORDER — METHYLPREDNISOLONE ACETATE 40 MG/ML SUSPENSION FOR INJECTION
40.0000 mg | INTRAMUSCULAR | Status: AC
Start: 2023-08-03 — End: 2023-08-03
  Administered 2023-08-03: 40 mg via INTRAMUSCULAR

## 2023-08-03 MED ORDER — PROMETHAZINE-DM 6.25 MG-15 MG/5 ML ORAL SYRUP
5.0000 mL | ORAL_SOLUTION | Freq: Four times a day (QID) | ORAL | 0 refills | Status: DC | PRN
Start: 2023-08-03 — End: 2023-10-23
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
## 2023-08-03 NOTE — Progress Notes (Signed)
FAMILY MEDICINE, MEDICAL OFFICE BUILDING  [address]  [place] [address]      SHATISHA MUILENBURG  Jun 09, 1946  [id number]    Date of Service: 08/03/2023  4:30 PM EST    Chief complaint:   Chief Complaint   Patient presents with    Cough    Congestion    Runny Nose    Headache     Subjective:     This is a case of a 77 y.o. female who presents today for complaint(s) of: Sinus Pain  Patient complains of facial pain, headache described as sinus, nasal congestion, and non productive cough. Onset of symptoms was 3 days ago, gradually worsening since that time. She is drinking moderate amounts of fluids. She endorses ineffective attempts at OTC treatment with Tylenol and Mucinex.     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    Carpal tunnel syndrome, left upper limb 09/24/2018    Cervical spondylosis 09/24/2017    DDD (degenerative disc disease), lumbar 03/25/2022    Depression, recurrent (CMS HCC) 01/17/2020    History of cervical cancer 10/23/2020    History of colon polyps 10/23/2020    Hyperlipidemia 01/17/2020    Thrombocytopenia (CMS HCC) 10/23/2020    Insomnia     Hypothyroidism     Anxiety with depression      Resolved Ambulatory Problems     Diagnosis Date Noted    No Resolved Ambulatory Problems     Past Medical History:   Diagnosis Date    Diverticulitis     Hiatal hernia      Current Outpatient Medications   Medication Sig    amoxicillin (AMOXIL) 875 mg Oral Tablet Take 1 Tablet (875 mg total) by mouth Twice daily for 7 days    busPIRone (BUSPAR) 10 mg Oral Tablet TAKE 1 TABLET BY MOUTH TWICE DAILY.    celecoxib (CELEBREX) 100 mg Oral Capsule Take 1 Capsule (100 mg total) by mouth Twice daily    citalopram (CELEXA) 20 mg Oral Tablet Take 1 Tablet (20 mg total) by mouth Once a day    ergocalciferol, vitamin D2, (DRISDOL) 1,250 mcg (50,000 unit) Oral Capsule Take 1 Capsule (50,000 Units total) by mouth Every 7 days    ezetimibe (ZETIA) 10  mg Oral Tablet Take 1 Tablet (10 mg total) by mouth Every evening    Mirtazapine (REMERON) 7.5 mg Oral Tablet Take 1 Tablet (7.5 mg total) by mouth Every night    omeprazole (PRILOSEC) 40 mg Oral Capsule, Delayed Release(E.C.) Take 1 Capsule (40 mg total) by mouth Once a day    promethazine-dextromethorphan (PHENERGAN-DM) 6.25-15 mg/5 mL Oral Syrup Take 5 mL by mouth Four times a day as needed for Cough     Review of Systems:  Any pertinent Review of Systems as addressed in the HPI above.    Objective   BP (!) 146/79   Pulse 90   Temp 36.9 C (98.4 F) (Temporal)   Resp (!) 22   Ht 1.588 m (5' 2.5")   Wt 61.6 kg (135 lb 12.8 oz)   SpO2 93%   BMI 24.44 kg/m      Physical Exam  Vitals reviewed.   Constitutional:       General: She is not in acute distress.     Appearance: Normal appearance.   HENT:      Nose: Congestion present.      Mouth/Throat:  Pharynx: Posterior oropharyngeal erythema present. No oropharyngeal exudate.   Eyes:      Pupils: Pupils are equal, round, and reactive to light.   Cardiovascular:      Rate and Rhythm: Normal rate and regular rhythm.      Pulses: Normal pulses.      Heart sounds: Normal heart sounds.   Pulmonary:      Effort: Pulmonary effort is normal.      Breath sounds: Normal breath sounds.   Skin:     General: Skin is warm and dry.      Capillary Refill: Capillary refill takes less than 2 seconds.   Neurological:      General: No focal deficit present.      Mental Status: She is alert and oriented to person, place, and time. Mental status is at baseline.   Psychiatric:         Attention and Perception: Attention normal.         Mood and Affect: Mood and affect normal.         Speech: Speech normal.         Behavior: Behavior normal. Behavior is cooperative.         Thought Content: Thought content normal.        Assessment & Plan  Cough, unspecified type  POCT testing negative for Covid and Influenza. Patient advised on the available treatment options. Prescriptions placed  for Promethazine-DM and Amoxicillin after the patient verbalized understanding and agreement with the proposed treatment plan. Depo-medrol ordered and given today in office.     The patient was given the opportunity to ask questions and those questions were answered to the patient's satisfaction. The patient was encouraged to call with any additional questions or concerns. I instructed the patient to follow-up if symptoms persist or worsen. Medication safety was discussed. A good faith effort was made to reconcile the patient's medications. More than 50% of the visit was spent counseling and coordinating care.    Orders Placed This Encounter    POCT Rapid Covid & Flu ([place])    methylPREDNISolone acetate (DEPO-medrol) 40 mg/mL injection    amoxicillin (AMOXIL) 875 mg Oral Tablet    promethazine-dextromethorphan (PHENERGAN-DM) 6.25-15 mg/5 mL Oral Syrup     Follow up: Return if symptoms worsen or fail to improve.  Ignacia Marvel, APRN,NP-C

## 2023-08-03 NOTE — Nursing Note (Signed)
08/03/23 1600   Rapid Flu   Time Performed 1604   Rapid Flu A Result Negative   Rapid Flu B Result Negative   Internal Control Valid yes   Initials HW   Influenza Culture Sent No   POCT Instrument [place]

## 2023-08-18 ENCOUNTER — Ambulatory Visit (HOSPITAL_COMMUNITY): Payer: Self-pay

## 2023-09-06 NOTE — Progress Notes (Deleted)
GENERAL SURGERY, [facility] MEDICAL GROUP GENERAL SURGERY  [address]  [place] [address]    Progress Note    Name: Crystal Ballard MRN:  [id number]   Date: 09/07/2023 DOB:  Jul 01, 1946 (78 y.o.)              Date of Birth:  05-08-1946  PCP: Ignacia Marvel, APRN,NP-C  Referring:  No ref. provider found     HPI:  Crystal Ballard is a 78 y.o. White female who returns for repeat evaluation of her thyroid nodules.  Seen by me 1 year ago with an FNA of right thyroid nodule which was benign.        Past Medical History:   Diagnosis Date    Anxiety with depression     Diverticulitis     Hiatal hernia     Hypothyroidism     Insomnia       Past Surgical History:   Procedure Laterality Date    COLONOSCOPY      HX HYSTERECTOMY      HX WISDOM TEETH EXTRACTION      SHOULDER SURGERY      SPINE SURGERY        No outpatient medications have been marked as taking for the 09/07/23 encounter (Appointment) with Esmond Plants, MD.      Allergies   Allergen Reactions    Pravastatin Myalgia and  Other Adverse Reaction (Add comment)     Foot pain at 20mg            There were no vitals taken for this visit.         General: appropriate for age. in no acute distress.    Vital signs are present above and have been reviewed by me     HEENT: Atraumatic, Normocephalic.    Lungs: Nonlabored breathing with symmetric expansion    Heart:Regular wth respect to rate.    Abdomen:Soft. Nontender. Nondistended     Psychiatric: Alert and oriented to person, place, and time. affect appropriate       Assessment/Plan:  Assessment/Plan   1. Thyroid nodule         ***      This note was partially created using voice recognition software and is inherently subject to errors including those of syntax and "sound alike " substitutions which may escape proof reading. In such instances, original meaning may be extrapolated by contextual derivation.    Maura Crandall MD MBA CPE FACS

## 2023-09-07 ENCOUNTER — Encounter (INDEPENDENT_AMBULATORY_CARE_PROVIDER_SITE_OTHER): Payer: Self-pay | Admitting: Surgery

## 2023-09-07 DIAGNOSIS — E041 Nontoxic single thyroid nodule: Secondary | ICD-10-CM

## 2023-09-12 NOTE — Progress Notes (Unsigned)
GENERAL SURGERY, [facility] MEDICAL GROUP GENERAL SURGERY  [address]  [place] [address]    Progress Note    Name: Crystal Ballard MRN:  [id number]   Date: 09/14/2023 DOB:  Jul 31, 1946 (78 y.o.)              Date of Birth:  1946-06-05  PCP: Ignacia Marvel, APRN,NP-C  Referring:  Ignacia Marvel     HPI:  Crystal Ballard is a 78 y.o. White female who returns for repeat evaluation of her thyroid nodules.  Seen by me 1 year ago with an FNA of right thyroid nodule which was benign.  She has no thyroid complaints at the current time.  Denies any difficulty with oral intake although she does occasionally feel that she gets "choked. " that is a longstanding problem and has not changed since the last time I saw her.  No difficulty with her voice.        Past Medical History:   Diagnosis Date    Anxiety with depression     Diverticulitis     Hiatal hernia     Hypothyroidism     Insomnia       Past Surgical History:   Procedure Laterality Date    COLONOSCOPY      HX HYSTERECTOMY      HX WISDOM TEETH EXTRACTION      SHOULDER SURGERY      SPINE SURGERY        Outpatient Medications Marked as Taking for the 09/14/23 encounter (Office Visit) with Esmond Plants, MD   Medication Sig    busPIRone (BUSPAR) 10 mg Oral Tablet TAKE 1 TABLET BY MOUTH TWICE DAILY.    celecoxib (CELEBREX) 100 mg Oral Capsule Take 1 Capsule (100 mg total) by mouth Twice daily    citalopram (CELEXA) 20 mg Oral Tablet Take 1 Tablet (20 mg total) by mouth Once a day    ergocalciferol, vitamin D2, (DRISDOL) 1,250 mcg (50,000 unit) Oral Capsule Take 1 Capsule (50,000 Units total) by mouth Every 7 days    ezetimibe (ZETIA) 10 mg Oral Tablet Take 1 Tablet (10 mg total) by mouth Every evening    Mirtazapine (REMERON) 7.5 mg Oral Tablet Take 1 Tablet (7.5 mg total) by mouth Every night    omeprazole (PRILOSEC) 40 mg Oral Capsule, Delayed Release(E.C.) Take 1 Capsule (40 mg total) by mouth Once a day    promethazine-dextromethorphan (PHENERGAN-DM) 6.25-15 mg/5  mL Oral Syrup Take 5 mL by mouth Four times a day as needed for Cough      Allergies   Allergen Reactions    Pravastatin Myalgia and  Other Adverse Reaction (Add comment)     Foot pain at 20mg            BP 127/82   Pulse 73   Temp 36.4 C (97.6 F)   Wt 61.2 kg (135 lb)   SpO2 96%   BMI 24.30 kg/m          General: appropriate for age. in no acute distress.    Vital signs are present above and have been reviewed by me     HEENT: Atraumatic, Normocephalic.  No significant thyroid enlargement noted today.  No dominant mass.    Lungs: Nonlabored breathing with symmetric expansion    Heart:Regular wth respect to rate.    Abdomen:Soft. Nontender. Nondistended     Psychiatric: Alert and oriented to person, place, and time. affect appropriate       Assessment/Plan:  Assessment/Plan   1. Thyroid nodule           Stable ultrasound examination of the previously biopsied nodule.  There is a small new nodule on the right which is overall not suspicious by ultrasound and less than 1 cm in size.  She essentially asymptomatic despite occasional "choking.  Feel that she is safe for ongoing observation of this.  Recheck her in 1 year or sooner if there is a change in her symptoms      This note was partially created using voice recognition software and is inherently subject to errors including those of syntax and "sound alike " substitutions which may escape proof reading. In such instances, original meaning may be extrapolated by contextual derivation.    Maura Crandall MD MBA CPE FACS

## 2023-09-14 ENCOUNTER — Ambulatory Visit (INDEPENDENT_AMBULATORY_CARE_PROVIDER_SITE_OTHER): Payer: Commercial Managed Care - PPO | Admitting: Surgery

## 2023-09-14 ENCOUNTER — Inpatient Hospital Stay (INDEPENDENT_AMBULATORY_CARE_PROVIDER_SITE_OTHER)
Admission: RE | Admit: 2023-09-14 | Discharge: 2023-09-14 | Disposition: A | Discharge status: Home / Self Care | Payer: Commercial Managed Care - PPO | Source: Ambulatory Visit

## 2023-09-14 ENCOUNTER — Encounter (INDEPENDENT_AMBULATORY_CARE_PROVIDER_SITE_OTHER): Payer: Self-pay | Admitting: Surgery

## 2023-09-14 ENCOUNTER — Other Ambulatory Visit (INDEPENDENT_AMBULATORY_CARE_PROVIDER_SITE_OTHER): Payer: Self-pay | Admitting: Surgery

## 2023-09-14 ENCOUNTER — Other Ambulatory Visit: Payer: Self-pay

## 2023-09-14 VITALS — BP 127/82 | HR 73 | Temp 97.6°F | Wt 135.0 lb

## 2023-09-14 DIAGNOSIS — E041 Nontoxic single thyroid nodule: Secondary | ICD-10-CM

## 2023-09-15 ENCOUNTER — Ambulatory Visit (HOSPITAL_COMMUNITY): Payer: Self-pay

## 2023-09-15 ENCOUNTER — Encounter (INDEPENDENT_AMBULATORY_CARE_PROVIDER_SITE_OTHER): Payer: Self-pay | Admitting: Surgery

## 2023-09-16 DIAGNOSIS — E041 Nontoxic single thyroid nodule: Secondary | ICD-10-CM

## 2023-10-13 ENCOUNTER — Encounter (INDEPENDENT_AMBULATORY_CARE_PROVIDER_SITE_OTHER): Payer: Self-pay

## 2023-10-16 ENCOUNTER — Other Ambulatory Visit (INDEPENDENT_AMBULATORY_CARE_PROVIDER_SITE_OTHER): Payer: Self-pay

## 2023-10-23 ENCOUNTER — Encounter (INDEPENDENT_AMBULATORY_CARE_PROVIDER_SITE_OTHER): Payer: Self-pay

## 2023-10-23 ENCOUNTER — Other Ambulatory Visit: Payer: Self-pay

## 2023-10-23 ENCOUNTER — Ambulatory Visit (INDEPENDENT_AMBULATORY_CARE_PROVIDER_SITE_OTHER)

## 2023-10-23 VITALS — BP 144/76 | HR 63 | Temp 97.2°F | Resp 19 | Ht 62.5 in | Wt 140.0 lb

## 2023-10-23 DIAGNOSIS — M810 Age-related osteoporosis without current pathological fracture: Secondary | ICD-10-CM

## 2023-10-23 DIAGNOSIS — G4700 Insomnia, unspecified: Secondary | ICD-10-CM

## 2023-10-23 DIAGNOSIS — F418 Other specified anxiety disorders: Secondary | ICD-10-CM

## 2023-10-23 DIAGNOSIS — E785 Hyperlipidemia, unspecified: Secondary | ICD-10-CM

## 2023-10-23 DIAGNOSIS — R030 Elevated blood-pressure reading, without diagnosis of hypertension: Secondary | ICD-10-CM

## 2023-10-23 DIAGNOSIS — E039 Hypothyroidism, unspecified: Secondary | ICD-10-CM

## 2023-10-23 MED ORDER — CITALOPRAM 10 MG TABLET
ORAL_TABLET | ORAL | 0 refills | Status: AC
Start: 2023-10-23 — End: 2023-11-06

## 2023-10-23 MED ORDER — EZETIMIBE 10 MG TABLET
10.0000 mg | ORAL_TABLET | Freq: Every evening | ORAL | 1 refills | Status: DC
Start: 2023-10-23 — End: 2023-12-30

## 2023-10-23 MED ORDER — BUSPIRONE 5 MG TABLET
5.0000 mg | ORAL_TABLET | Freq: Three times a day (TID) | ORAL | 0 refills | Status: AC | PRN
Start: 2023-10-23 — End: ?

## 2023-10-23 MED ORDER — OMEPRAZOLE 40 MG CAPSULE,DELAYED RELEASE
40.0000 mg | DELAYED_RELEASE_CAPSULE | Freq: Every day | ORAL | 4 refills | Status: AC
Start: 2023-10-23 — End: ?

## 2023-10-23 MED ORDER — CELECOXIB 100 MG CAPSULE
100.0000 mg | ORAL_CAPSULE | Freq: Three times a day (TID) | ORAL | 1 refills | Status: DC | PRN
Start: 2023-10-23 — End: 2023-12-30

## 2023-10-23 MED ORDER — MIRTAZAPINE 7.5 MG TABLET
3.7500 mg | ORAL_TABLET | Freq: Every evening | ORAL | 0 refills | Status: AC
Start: 2023-10-23 — End: 2023-11-06

## 2023-10-23 MED ORDER — HYDROXYZINE HCL 25 MG TABLET
25.0000 mg | ORAL_TABLET | Freq: Every evening | ORAL | 1 refills | Status: AC
Start: 2023-10-23 — End: ?

## 2023-10-23 NOTE — Assessment & Plan Note (Signed)
 Patient states that she is in a good relationship and does not believe her antidepressant medications are necessary any longer. She was advised on the treatment options. Taper doses ordered for Remeron  and Celexa . Buspirone  order changed to PRN.

## 2023-10-23 NOTE — Assessment & Plan Note (Signed)
 Lab Results   Component Value Date    CHOLESTEROL 213 (H) 07/15/2023    HDLCHOL 66 07/15/2023    LDLCHOL 124 (H) 07/15/2023    TRIG 116 07/15/2023   Repeat lipid panel ordered. Zetia  refilled.

## 2023-10-23 NOTE — Assessment & Plan Note (Signed)
 Lab Results   Component Value Date    TSH 1.009 07/15/2023   TSH WNL. No active treatment. Repeat TSH ordered.

## 2023-10-23 NOTE — Progress Notes (Signed)
 PRN MEDICAL OFFICE [facility]  FAMILY MEDICINE, MEDICAL OFFICE BUILDING  [address]  [place] [address]  [phone]   Crystal Ballard  26-Jun-1946  [id number]    Date of Service: 10/23/2023  Chief complaint:   Chief Complaint   Patient presents with    Follow Up 3 Months     Patient wants to discuss her medications, she is wanting to stop taking some of them if possible.   Celebrex  "wears off" quickly.    Back Pain     BP worse with exertion/house work. H/O back surgery 2020-2021 in [place] .     Leg Pain    Insomnia     Subjective:   Crystal Ballard is a 78 y.o. female and presents today for follow-up on chronic disease management, medication refills and lab work. Patient endorses worsening back pain refractory to Celebrex . She also would like to discuss discontinuing some of her medications. Patient claims acute exacerbation of her chronic insomnia.    Past Medical History:   Diagnosis Date    Anxiety with depression     Diverticulitis     Hiatal hernia     Hypothyroidism     Insomnia      Past Surgical History:   Procedure Laterality Date    COLONOSCOPY      HX HYSTERECTOMY      HX WISDOM TEETH EXTRACTION      SHOULDER SURGERY      SPINE SURGERY       Current Outpatient Medications   Medication Sig Dispense Refill    busPIRone  (BUSPAR ) 5 mg Oral Tablet Take 1 Tablet (5 mg total) by mouth Three times a day as needed 90 Tablet 0    calcium carbonate/vitamin D3 (VITAMIN D -3 ORAL) Take 500 mg by mouth Once a day      celecoxib  (CELEBREX ) 100 mg Oral Capsule Take 1 Capsule (100 mg total) by mouth Three times a day as needed for Pain 90 Capsule 1    citalopram  (CELEXA ) 10 mg Oral Tablet Take 1 Tablet (10 mg total) by mouth Daily for 7 days, THEN 0.5 Tablets (5 mg total) Daily for 7 days. 11 Tablet 0    ezetimibe  (ZETIA ) 10 mg Oral Tablet Take 1 Tablet (10 mg total) by mouth Every evening 90 Tablet 1    hydrOXYzine  HCL (ATARAX ) 25 mg Oral Tablet Take 1-2 Tablets (25-50 mg total) by mouth Every  evening 90 Tablet 1    Mirtazapine  (REMERON ) 7.5 mg Oral Tablet Take 0.5 Tablets (3.75 mg total) by mouth Every night for 14 days 7 Tablet 0    omeprazole  (PRILOSEC) 40 mg Oral Capsule, Delayed Release(E.C.) Take 1 Capsule (40 mg total) by mouth Daily 90 Capsule 4     No current facility-administered medications for this visit.     Allergies   Allergen Reactions    Pravastatin Myalgia and  Other Adverse Reaction (Add comment)     Foot pain at 20mg      Review of Systems:  Any pertinent Review of Systems as addressed in the HPI above.    Objective:   BP (!) 144/76 (Site: Left Arm, Patient Position: Sitting, Cuff Size: Adult)   Pulse 63   Temp 36.2 C (97.2 F) (Temporal)   Resp 19   Ht 1.588 m (5' 2.5")   Wt 63.5 kg (140 lb)   SpO2 97%   BMI 25.20 kg/m      Physical Exam  Vitals reviewed.   Constitutional:  General: She is not in acute distress.     Appearance: Normal appearance.   HENT:      Nose: Nose normal.      Mouth/Throat:      Mouth: Mucous membranes are moist.   Eyes:      Pupils: Pupils are equal, round, and reactive to light.   Cardiovascular:      Rate and Rhythm: Normal rate and regular rhythm.      Pulses: Normal pulses.      Heart sounds: Normal heart sounds.   Pulmonary:      Effort: Pulmonary effort is normal.      Breath sounds: Normal breath sounds.   Abdominal:      General: Abdomen is flat. Bowel sounds are normal.      Palpations: Abdomen is soft.   Musculoskeletal:         General: Normal range of motion.      Cervical back: Normal range of motion.   Skin:     General: Skin is warm and dry.      Capillary Refill: Capillary refill takes less than 2 seconds.   Neurological:      General: No focal deficit present.      Mental Status: She is alert and oriented to person, place, and time. Mental status is at baseline.   Psychiatric:         Attention and Perception: Attention normal.         Mood and Affect: Mood and affect normal.         Speech: Speech normal.         Behavior:  Behavior normal. Behavior is cooperative.         Thought Content: Thought content normal.       Laboratory:  COMPLETE BLOOD COUNT   Lab Results   Component Value Date    WBC 6.6 07/15/2023    HGB 13.2 07/15/2023    HCT 39.2 07/15/2023    PLTCNT 107 (L) 07/15/2023     DIFFERENTIAL  Lab Results   Component Value Date    PMNS 64 07/15/2023    LYMPHOCYTES 26 07/15/2023    MONOCYTES 10 07/15/2023    EOSINOPHIL 1 07/15/2023    BASOPHILS 0 07/15/2023    BASOPHILS 0.00 07/15/2023    PMNABS 4.20 07/15/2023    LYMPHSABS 1.70 07/15/2023    EOSABS 0.00 07/15/2023    MONOSABS 0.60 07/15/2023     BASIC METABOLIC PANEL  Lab Results   Component Value Date    SODIUM 141 07/15/2023    POTASSIUM 3.9 07/15/2023    CHLORIDE 107 07/15/2023    CO2 30 07/15/2023    ANIONGAP 4 07/15/2023    BUN 13 07/15/2023    CREATININE 0.73 07/15/2023    BUNCRRATIO 18 07/15/2023    GFR 85 07/15/2023    CALCIUM 8.9 07/15/2023    GLUCOSENF 62 (L) 07/15/2023      LIPID PROFILE  Lab Results   Component Value Date    CHOLESTEROL 213 (H) 07/15/2023    HDLCHOL 66 07/15/2023    LDLCHOL 124 (H) 07/15/2023    TRIG 116 07/15/2023     LIVER TESTS  Lab Results   Component Value Date    ALBUMIN 4.2 07/15/2023    AST 16 07/15/2023    ALT 10 07/15/2023    ALKPHOS 62 07/15/2023    TOTBILIRUBIN 0.4 07/15/2023    BILIRUBINCON <0.10 07/15/2023     THYROID  STIMULATING HORMONE  Lab Results  Component Value Date    TSH 1.009 07/15/2023   Most recent lab results reviewed with patient.  Assessment & Plan  Osteoporosis, unspecified osteoporosis type, unspecified pathological fracture presence  Dexa scan ordered  Hyperlipidemia, unspecified hyperlipidemia type  Lab Results   Component Value Date    CHOLESTEROL 213 (H) 07/15/2023    HDLCHOL 66 07/15/2023    LDLCHOL 124 (H) 07/15/2023    TRIG 116 07/15/2023   Repeat lipid panel ordered. Zetia  refilled.  Insomnia, unspecified type  Patient advised on the available treatment options. Prescription placed for Vistaril  25-50 mg  QHS.  Hypothyroidism, unspecified type  Lab Results   Component Value Date    TSH 1.009 07/15/2023   TSH WNL. No active treatment. Repeat TSH ordered.  Anxiety with depression  Patient states that she is in a good relationship and does not believe her antidepressant medications are necessary any longer. She was advised on the treatment options. Taper doses ordered for Remeron  and Celexa . Buspirone  order changed to PRN.  Elevated BP without diagnosis of hypertension  BP Readings from Last 3 Encounters:   10/23/23 (!) 144/76   09/14/23 127/82   08/03/23 (!) 146/79   BP slightly elevated. Patient instructed to maintain a home BP journal and follow-up if values remain > 140/90.    Counseling with regards to preventative care given. Medication summary was discussed with the patient to verify compliance and understanding. Medication safety was discussed. A good faith effort was made to reconcile the patient's medications. Lab orders were placed for BMP, CBC, Hepatic function panel, Lipid panel and TSH. The patient was given the opportunity to ask questions and those questions were answered to the patient's satisfaction. The patient was encouraged to call with any additional questions or concerns. More than 50% of the visit was spent counseling and coordinating care.    Orders Placed This Encounter    DEXA BONE DENSITOMETRY    BASIC METABOLIC PANEL    CBC/DIFF    HEPATIC FUNCTION PANEL    LIPID PANEL    THYROID  STIMULATING HORMONE WITH FREE T4 REFLEX    citalopram  (CELEXA ) 10 mg Oral Tablet    Mirtazapine  (REMERON ) 7.5 mg Oral Tablet    omeprazole  (PRILOSEC) 40 mg Oral Capsule, Delayed Release(E.C.)    ezetimibe  (ZETIA ) 10 mg Oral Tablet    celecoxib  (CELEBREX ) 100 mg Oral Capsule    busPIRone  (BUSPAR ) 5 mg Oral Tablet    hydrOXYzine  HCL (ATARAX ) 25 mg Oral Tablet     Return in about 3 months (around 01/23/2024).  Willadean Hark, APRN,NP-C 10/23/2023, 13:52

## 2023-10-23 NOTE — Assessment & Plan Note (Signed)
 Patient advised on the available treatment options. Prescription placed for Vistaril  25-50 mg QHS.

## 2023-11-06 ENCOUNTER — Ambulatory Visit
Admission: RE | Admit: 2023-11-06 | Discharge: 2023-11-06 | Disposition: A | Discharge status: Home / Self Care | Payer: Self-pay | Source: Ambulatory Visit

## 2023-11-06 ENCOUNTER — Other Ambulatory Visit: Payer: Self-pay

## 2023-11-06 DIAGNOSIS — M810 Age-related osteoporosis without current pathological fracture: Secondary | ICD-10-CM | POA: Insufficient documentation

## 2023-12-14 ENCOUNTER — Encounter (INDEPENDENT_AMBULATORY_CARE_PROVIDER_SITE_OTHER): Payer: Self-pay | Admitting: Surgery

## 2023-12-14 ENCOUNTER — Ambulatory Visit (INDEPENDENT_AMBULATORY_CARE_PROVIDER_SITE_OTHER): Payer: Self-pay | Admitting: Surgery

## 2023-12-14 ENCOUNTER — Other Ambulatory Visit: Payer: Self-pay

## 2023-12-14 VITALS — BP 145/83 | HR 75 | Temp 98.2°F | Wt 132.0 lb

## 2023-12-14 DIAGNOSIS — K6289 Other specified diseases of anus and rectum: Secondary | ICD-10-CM

## 2023-12-14 DIAGNOSIS — R194 Change in bowel habit: Secondary | ICD-10-CM

## 2023-12-14 MED ORDER — PEG 3350-ELECTROLYTES 236 GRAM-22.74 GRAM-6.74 GRAM-5.86 GRAM SOLUTION
4.0000 L | Freq: Once | ORAL | 0 refills | Status: AC
Start: 2023-12-14 — End: 2023-12-14

## 2023-12-17 ENCOUNTER — Encounter (INDEPENDENT_AMBULATORY_CARE_PROVIDER_SITE_OTHER): Payer: Self-pay | Admitting: Surgery

## 2023-12-17 NOTE — H&P (Signed)
 GENERAL SURGERY, [facility] MEDICAL GROUP GENERAL SURGERY  [address]  [place] [address]       Name: Crystal Ballard MRN:  [id number]   Date: 12/14/2023 Age: 78 y.o. [birth year]      PCP: Willadean Hark, APRN,NP-C     Subjective  Crystal Ballard is a 78 y.o. year old female who presents for worsening rectal pain.  Associated with worsening constipation.  Has mucus that comes with bowel movements as well as between.  Sensation of tenesmus or incomplete emptying with bowel movements as well.  All of the symptoms have just started in the past 3-4 months  No abdominal pain.  No rectal bleeding.  No unexplained weight loss.      Family history of colon cancer:  No    Last colonoscopy:  4-5 years ago    Patient has had multiple prior back interventions.  Has not been told that she has a any cord compression or other findings that would cause any anorectal symptoms.    Patient's referral to this office included a recent assessment by the referring provider.  This was reviewed by me for this unique office visit for the indication and intent of the referral as well as any pertinent medical or surgical history relevant to the patient's independent evaluation by me today.     Allergies   Allergen Reactions    Pravastatin Myalgia and  Other Adverse Reaction (Add comment)     Foot pain at 20mg       Current Outpatient Medications   Medication Sig    busPIRone  (BUSPAR ) 5 mg Oral Tablet Take 1 Tablet (5 mg total) by mouth Three times a day as needed    calcium carbonate/vitamin D3 (VITAMIN D -3 ORAL) Take 500 mg by mouth Once a day    celecoxib  (CELEBREX ) 100 mg Oral Capsule Take 1 Capsule (100 mg total) by mouth Three times a day as needed for Pain    citalopram  (CELEXA ) 10 mg Oral Tablet Take 1 Tablet (10 mg total) by mouth Daily for 7 days, THEN 0.5 Tablets (5 mg total) Daily for 7 days.    ezetimibe  (ZETIA ) 10 mg Oral Tablet Take 1 Tablet (10 mg total) by mouth Every evening    hydrOXYzine  HCL (ATARAX ) 25 mg Oral Tablet  Take 1-2 Tablets (25-50 mg total) by mouth Every evening    omeprazole  (PRILOSEC) 40 mg Oral Capsule, Delayed Release(E.C.) Take 1 Capsule (40 mg total) by mouth Daily          Objective:       Vitals:    12/14/23 1439   BP: (!) 145/83   Pulse: 75   Temp: 36.8 C (98.2 F)   SpO2: 92%   Weight: 59.9 kg (132 lb)        General: appropriate for age. in no acute distress.    Vital signs are present above and have been reviewed by me     HEENT: Atraumatic, Normocephalic.    Lungs: Nonlabored breathing with symmetric expansion    Heart:Regular wth respect to rate and rythmn.    Abdomen:Soft. Nontender. Nondistended     Psychiatric: Alert and oriented to person, place, and time. affect appropriate    Assessment/Plan    ICD-10-CM    1. Change in bowel habits  R19.4            Discussed indications, risks, and benefits of colonoscopy with the patient.  Discussed the possibility of polypectomy, biopsies, and repeat possible examinations.  Risks discussed include bleeding, sedation risks, possibility of missed diagnosis of polyp malignancy, and remote possibility of perforation and/or death.  All questions answered and informed consent clearly obtained.    Office Visit was used for detailed explanation procedure and its indications, review of the patient's medications relative to the time before and after the procedure, and the effects of the associated medical conditions that affect the procedure preparation and procedure itself.    Alica Inks MD MBA CPE FACS

## 2023-12-26 ENCOUNTER — Emergency Department
Admission: EM | Admit: 2023-12-26 | Discharge: 2023-12-26 | Discharge status: Against Medical Advice | Attending: NURSE PRACTITIONER | Admitting: NURSE PRACTITIONER

## 2023-12-26 ENCOUNTER — Other Ambulatory Visit: Payer: Self-pay

## 2023-12-26 DIAGNOSIS — R110 Nausea: Secondary | ICD-10-CM | POA: Insufficient documentation

## 2023-12-26 DIAGNOSIS — Z5329 Procedure and treatment not carried out because of patient's decision for other reasons: Secondary | ICD-10-CM | POA: Insufficient documentation

## 2023-12-26 DIAGNOSIS — R140 Abdominal distension (gaseous): Secondary | ICD-10-CM | POA: Insufficient documentation

## 2023-12-26 NOTE — ED Nurses Note (Signed)
 Lab, ct and triage nurse have looked for patient multiple times since 1915 without being able to find patient. Assumed eloped. Provider notified.

## 2023-12-26 NOTE — ED Triage Notes (Signed)
 [facility] - Emergency Department  Emergency Department  Provider in Triage Note    Name: MIKALYN HERMIDA  Age: 78 y.o.  Gender: female     Subjective:   Kellene M Christner is a 78 y.o. female who presents with complaint of Nausea  .  She reports nausea for 3 days.  She has been having trouble with her bowels. She is having mucous in her stools, bloating in abdomen and feeling of incomplete evacuation.  She is scheduled for a colonoscopy in June.  She denies hematochezia, melena.  She denies unintentional weight loss.  She states [facility] sent her to the emergency department for a CT scan.    Objective:   There were no vitals filed for this visit.   Focused Physical Exam shows 77 year old female, no acute distress.  Skin pink warm and dry.  Buccal mucosa pink and moist.    Assessment:  A medical screening exam was completed.  This patient is a 78 y.o. female with initial findings showing abdominal bloating, nausea.    Plan:  Please see initial orders and work-up below.  This is to be continued with full evaluation in the main Emergency Department.     No current facility-administered medications for this encounter.     No results found for this or any previous visit (from the past 24 hours).     Michiel Ala, NP, FNP-C  12/26/2023, 18:02

## 2023-12-26 NOTE — ED Triage Notes (Signed)
 Sent by [facility] for ct per pt for nausea  x 3 days

## 2023-12-28 ENCOUNTER — Other Ambulatory Visit: Payer: Self-pay

## 2023-12-28 ENCOUNTER — Emergency Department (HOSPITAL_BASED_OUTPATIENT_CLINIC_OR_DEPARTMENT_OTHER)

## 2023-12-28 ENCOUNTER — Emergency Department
Admission: EM | Admit: 2023-12-28 | Discharge: 2023-12-28 | Disposition: A | Discharge status: Home / Self Care | Attending: FAMILY PRACTICE | Admitting: FAMILY PRACTICE

## 2023-12-28 ENCOUNTER — Encounter (HOSPITAL_BASED_OUTPATIENT_CLINIC_OR_DEPARTMENT_OTHER): Payer: Self-pay

## 2023-12-28 DIAGNOSIS — K5901 Slow transit constipation: Secondary | ICD-10-CM | POA: Insufficient documentation

## 2023-12-28 DIAGNOSIS — Z90710 Acquired absence of both cervix and uterus: Secondary | ICD-10-CM | POA: Insufficient documentation

## 2023-12-28 DIAGNOSIS — G4700 Insomnia, unspecified: Secondary | ICD-10-CM | POA: Insufficient documentation

## 2023-12-28 DIAGNOSIS — Z860100 Personal history of colon polyps, unspecified: Secondary | ICD-10-CM | POA: Insufficient documentation

## 2023-12-28 DIAGNOSIS — R1033 Periumbilical pain: Secondary | ICD-10-CM | POA: Insufficient documentation

## 2023-12-28 DIAGNOSIS — F418 Other specified anxiety disorders: Secondary | ICD-10-CM | POA: Insufficient documentation

## 2023-12-28 DIAGNOSIS — R109 Unspecified abdominal pain: Secondary | ICD-10-CM

## 2023-12-28 DIAGNOSIS — K449 Diaphragmatic hernia without obstruction or gangrene: Secondary | ICD-10-CM | POA: Insufficient documentation

## 2023-12-28 DIAGNOSIS — Z981 Arthrodesis status: Secondary | ICD-10-CM | POA: Insufficient documentation

## 2023-12-28 DIAGNOSIS — E039 Hypothyroidism, unspecified: Secondary | ICD-10-CM | POA: Insufficient documentation

## 2023-12-28 DIAGNOSIS — K5792 Diverticulitis of intestine, part unspecified, without perforation or abscess without bleeding: Secondary | ICD-10-CM | POA: Insufficient documentation

## 2023-12-28 LAB — URINALYSIS, MACRO/MICRO
BILIRUBIN: NEGATIVE mg/dL
BLOOD: NEGATIVE mg/dL
GLUCOSE: NEGATIVE mg/dL
KETONES: NEGATIVE mg/dL
LEUKOCYTES: NEGATIVE WBCs/uL
NITRITE: NEGATIVE
PH: 7 (ref 4.6–8.0)
PROTEIN: NEGATIVE mg/dL
SPECIFIC GRAVITY: 1.01 (ref 1.003–1.035)
UROBILINOGEN: 0.2 mg/dL (ref 0.2–1.0)

## 2023-12-28 LAB — COMPREHENSIVE METABOLIC PANEL, NON-FASTING
ALBUMIN/GLOBULIN RATIO: 1.2 (ref 0.8–1.4)
ALBUMIN: 3.9 g/dL (ref 3.4–5.0)
ALKALINE PHOSPHATASE: 72 U/L (ref 46–116)
ALT (SGPT): 20 U/L (ref <=78)
ANION GAP: 6 mmol/L (ref 4–13)
AST (SGOT): 19 U/L (ref 15–37)
BILIRUBIN TOTAL: 0.5 mg/dL (ref 0.2–1.0)
BUN/CREA RATIO: 17
BUN: 11 mg/dL (ref 7–18)
CALCIUM, CORRECTED: 9.6 mg/dL
CALCIUM: 9.5 mg/dL (ref 8.5–10.1)
CHLORIDE: 106 mmol/L (ref 98–107)
CO2 TOTAL: 30 mmol/L (ref 21–32)
CREATININE: 0.65 mg/dL (ref 0.55–1.02)
ESTIMATED GFR: 91 mL/min/{1.73_m2} (ref >59)
GLOBULIN: 3.2
GLUCOSE: 104 mg/dL (ref 74–106)
OSMOLALITY, CALCULATED: 283 mosm/kg (ref 270–290)
POTASSIUM: 4.4 mmol/L (ref 3.5–5.1)
PROTEIN TOTAL: 7.1 g/dL (ref 6.4–8.2)
SODIUM: 142 mmol/L (ref 136–145)

## 2023-12-28 LAB — CBC WITH DIFF
BASOPHIL #: 0.02 10*3/uL (ref 0.00–0.10)
BASOPHIL %: 0 % (ref 0–1)
EOSINOPHIL #: 0.05 10*3/uL (ref 0.00–0.50)
EOSINOPHIL %: 1 % (ref 1–7)
HCT: 42.9 % — ABNORMAL HIGH (ref 31.2–41.9)
HGB: 14.3 g/dL (ref 10.9–14.3)
LYMPHOCYTE #: 1.59 10*3/uL (ref 1.10–3.10)
LYMPHOCYTE %: 25 % (ref 16–46)
MCH: 31.8 pg (ref 24.7–32.8)
MCHC: 33.4 g/dL (ref 32.3–35.6)
MCV: 95.4 fL — ABNORMAL HIGH (ref 75.5–95.3)
MONOCYTE #: 0.61 10*3/uL (ref 0.20–0.90)
MONOCYTE %: 9 % (ref 4–11)
MPV: 11.8 fL — ABNORMAL HIGH (ref 7.9–10.8)
NEUTROPHIL #: 4.18 10*3/uL (ref 1.90–8.20)
NEUTROPHIL %: 65 % (ref 43–77)
PLATELETS: 125 10*3/uL — ABNORMAL LOW (ref 140–440)
RBC: 4.5 10*6/uL (ref 3.63–4.92)
RDW: 14.1 % (ref 12.3–17.7)
WBC: 6.4 10*3/uL (ref 3.8–11.8)

## 2023-12-28 LAB — BLUE TOP TUBE

## 2023-12-28 LAB — OCCULT BLOOD (PRN ED USE ONLY): OCCULT BLOOD: NEGATIVE

## 2023-12-28 LAB — LIPASE: LIPASE: 33 U/L (ref 16–77)

## 2023-12-28 NOTE — Discharge Instructions (Signed)
 Your laboratory data and x-rays were all normal, except for obstipation or constipation with a moderate stool burden noted in the colon.  Increase your fluids, you can use senna  or Senakot-S this is a 5 or and stimulant, along with continuing increase fluid intake to treat your constipation.  Keep your appointment with Dr. Doyne Genin coming up in June, follow up with your primary care provider if needed and continuing home medications that you may take.    Of worsening of your symptoms, her abdominal pain or changes in symptoms that are worrisome you can return to the emergency department for further evaluation and care.

## 2023-12-28 NOTE — ED Provider Notes (Signed)
 [facility], [facility] - Emergency Department  ED Primary Provider Note  History of Present Illness   Chief Complaint   Patient presents with    Abdominal Pain     Patient reports upper abdominal pain, irregular bowel movements. Last BM Thursday after taking laxative. Scheduled for colonoscopy in June.      Crystal Ballard is a 78 y.o. female who had concerns including Abdominal Pain.  Arrival: The patient arrived by Car    This 78 year old female patient presents emergency department with obstipation, stating her last bowel movement was Thursday and that was after taking Correctol.  She states until a couple months ago she has had normal bowel movements no constipation.  Over the last 2-3 weeks she states she has early satiety, abdominal fullness and difficulty emptying her bowels.  She does have a history of colonic polyps and polypectomies, she has had several colonoscopies in the past, most recently within the last 3-5 years.  She is due to have a colonoscopy by Dr. Doyne Genin next month.  Past medical history is significant for colonic polyps, diverticulitis, depression with anxiety, insomnia, hiatal hernia, hypothyroidism.  Past surgeries include a colonoscopies and polypectomies, hysterectomy, wisdom teeth extraction, shoulder surgery, and cervical fusion.  She does smoke, denies alcohol marijuana or illicit drug use.    Patient is COVID vaccinated, is not flu vaccinated for this past season.  She has not had flu or RSV this past season      Abdominal Pain    History Reviewed This Encounter:  Past medical, surgical, social history reviewed noted.    Physical Exam   ED Triage Vitals [12/28/23 0941]   BP (Non-Invasive) (!) 155/80   Heart Rate 71   Respiratory Rate 18   Temperature 37 C (98.6 F)   SpO2 98 %   Weight 54.4 kg (120 lb)   Height 1.575 m (5\' 2" )     Physical Exam  General: No acute distress nontoxic   Neck: Supple, no carotid bruits or JVD.    Lungs: Clear symmetrical good  aeration   Heart: Regular rate and rhythm without murmur   Abdomen: Soft, normal bowel sounds, does have epigastrium and upper periumbilical pain to palpation without guarding, rebound, or referred pain.    Rectal:  She has good rectal tone.  Small nonthrombosed internal hemorrhoids noted, no masses and digital reach, scant amount of stool placed on a non expired Hemoccult card for development.  Extremities: Moving symmetrically   Neurological: No focal deficits   Skin:  No suspicious rashes, lesions, edema.    Patient Data     Labs Ordered/Reviewed   CBC WITH DIFF - Abnormal; Notable for the following components:       Result Value    HCT 42.9 (*)     MCV 95.4 (*)     PLATELETS 125 (*)     MPV 11.8 (*)     All other components within normal limits   OCCULT BLOOD (PRN ED USE ONLY) - Normal   LIPASE - Normal   URINALYSIS, MACRO/MICRO - Normal   CBC/DIFF    Narrative:     The following orders were created for panel order CBC/DIFF.  Procedure                               Abnormality         Status                     ---------                               -----------         ------  CBC WITH DIFF[[id number]]                Abnormal            Final result                 Please view results for these tests on the individual orders.   COMPREHENSIVE METABOLIC PANEL, NON-FASTING    Narrative:     Estimated Glomerular Filtration Rate (eGFR) is calculated using the CKD-EPI (2021) equation, intended for patients 69 years of age and older. If gender is not documented or "unknown", there will be no eGFR calculation.   URINALYSIS WITH REFLEX MICROSCOPIC AND CULTURE IF POSITIVE    Narrative:     The following orders were created for panel order URINALYSIS WITH REFLEX MICROSCOPIC AND CULTURE IF POSITIVE.  Procedure                               Abnormality         Status                     ---------                               -----------         ------                     URINALYSIS, MACRO/MICRO[[id number]]       Normal              Final result                 Please view results for these tests on the individual orders.   BLUE TOP TUBE     XR KUB   Final Result by Edi, Radresults In (05/26 1056)   Moderate colonic stool retention   No small bowel obstruction   Probable left renal stones            Radiologist location ID: [facility]           Medical Decision Making        Medical Decision Making  Moderate complexity straightforward differential includes slow transit constipation, colon mass, dysmotility.  Labs were all within normal limits, KUB shows moderate stool burden, no obstructions.  We will treat with senna or senna S, push the fluids, follow up with Dr. Doyne Genin as scheduled for her screening repeat colonoscopy.    Amount and/or Complexity of Data Reviewed  Labs: ordered.     Details: Labs reviewed and noted  Radiology: ordered.     Details: KUB reviewed and noted    Risk  OTC drugs.      ED Course as of 12/28/23 1158   Mon Dec 28, 2023   1150 Stool is heme-negative, CMP is normal, urinalysis is unremarkable, CBC has normal white count at 6.4 with normal differential.  X-ray shows obstipation.  See discharge               Clinical Impression   Slow transit constipation (Primary)   Acute periumbilical pain       Disposition: Discharged

## 2023-12-30 ENCOUNTER — Other Ambulatory Visit: Payer: Self-pay

## 2023-12-30 ENCOUNTER — Ambulatory Visit: Payer: Self-pay

## 2023-12-30 ENCOUNTER — Encounter (INDEPENDENT_AMBULATORY_CARE_PROVIDER_SITE_OTHER): Payer: Self-pay

## 2023-12-30 VITALS — BP 152/80 | HR 66 | Temp 97.6°F | Resp 16 | Ht 62.0 in | Wt 132.2 lb

## 2023-12-30 DIAGNOSIS — K5900 Constipation, unspecified: Secondary | ICD-10-CM | POA: Insufficient documentation

## 2023-12-30 DIAGNOSIS — D649 Anemia, unspecified: Secondary | ICD-10-CM | POA: Insufficient documentation

## 2023-12-30 LAB — FOLATE: FOLATE: 18.2 ng/mL (ref 5.9–24.8)

## 2023-12-30 LAB — VITAMIN B12: VITAMIN B 12: 314 pg/mL (ref 180–914)

## 2023-12-30 NOTE — Progress Notes (Signed)
 [facility], MEDICAL OFFICE BUILDING  [address]  [place] [address]  Operated by [facility]    Crystal Ballard  1946-08-04  [id number]    Date of Service: 12/30/2023  2:45 PM EDT    Chief complaint:   Chief Complaint   Patient presents with    Bloated    Constipation     Subjective:   This is a case of a 78 y.o. female who presents today for follow-up on recent ED visit. Patient presented to ED 05/26 c/o constipation. KUB revealed moderate colonic stool retention without obstruction. She was advised to take OTC senokot and discharged to home. Senokot daily resolved constipation. Bowels movements 3 times yesterday.    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    Carpal tunnel syndrome, left upper limb 09/24/2018    Cervical spondylosis 09/24/2017    DDD (degenerative disc disease), lumbar 03/25/2022    Depression, recurrent (CMS HCC) 01/17/2020    History of cervical cancer 10/23/2020    History of colon polyps 10/23/2020    Hyperlipidemia 01/17/2020    Thrombocytopenia (CMS HCC) 10/23/2020    Insomnia     Hypothyroidism     Anxiety with depression      Resolved Ambulatory Problems     Diagnosis Date Noted    No Resolved Ambulatory Problems     Past Medical History:   Diagnosis Date    Diverticulitis     Hiatal hernia      Review of Systems:  Any pertinent Review of Systems as addressed in the HPI above.    Objective   BP (!) 152/80   Pulse 66   Temp 36.4 C (97.6 F) (Temporal)   Resp 16   Ht 1.575 m (5\' 2" )   Wt 60 kg (132 lb 3.2 oz)   BMI 24.18 kg/m      Physical Exam  Vitals reviewed.   Constitutional:       General: She is not in acute distress.     Appearance: Normal appearance.   HENT:      Nose: Nose normal.      Mouth/Throat:      Mouth: Mucous membranes are moist.   Eyes:      Pupils: Pupils are equal, round, and reactive to light.   Cardiovascular:      Rate and Rhythm: Normal rate and regular rhythm.       Pulses: Normal pulses.      Heart sounds: Normal heart sounds.   Pulmonary:      Effort: Pulmonary effort is normal.      Breath sounds: Normal breath sounds.   Abdominal:      General: Abdomen is flat. Bowel sounds are normal.      Palpations: Abdomen is soft.   Musculoskeletal:         General: Normal range of motion.      Cervical back: Normal range of motion.   Skin:     General: Skin is warm and dry.      Capillary Refill: Capillary refill takes less than 2 seconds.   Neurological:      General: No focal deficit present.      Mental Status: She is alert and oriented to person, place, and time. Mental status is at baseline.   Psychiatric:         Attention and Perception: Attention normal.  Mood and Affect: Mood and affect normal.         Speech: Speech normal.         Behavior: Behavior normal. Behavior is cooperative.         Thought Content: Thought content normal.  Assessment & Plan  Constipation, unspecified constipation type  Patient endorses resolution of constipation symptoms with OTC senokot. No interventions necessary.  Anemia, unspecified type  Macrocytic anemia on recent CBC. Serum Folate and B12 orders placed.    The patient was given the opportunity to ask questions and those questions were answered to the patient's satisfaction. The patient was encouraged to call with any additional questions or concerns. I instructed the patient to follow-up if symptoms persist or worsen. Medication safety was discussed. A good faith effort was made to reconcile the patient's medications. More than 50% of the visit was spent counseling and coordinating care.    Orders Placed This Encounter    FOLATE    VITAMIN B12     Follow up: Return if symptoms worsen or fail to improve.  Willadean Hark, APRN,NP-C
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
## 2024-01-01 ENCOUNTER — Ambulatory Visit (INDEPENDENT_AMBULATORY_CARE_PROVIDER_SITE_OTHER): Payer: Self-pay

## 2024-01-01 ENCOUNTER — Encounter (INDEPENDENT_AMBULATORY_CARE_PROVIDER_SITE_OTHER): Payer: Self-pay

## 2024-01-21 ENCOUNTER — Ambulatory Visit (HOSPITAL_COMMUNITY): Admitting: Surgery

## 2024-01-21 ENCOUNTER — Ambulatory Visit (HOSPITAL_COMMUNITY): Admitting: Certified Registered"

## 2024-01-21 ENCOUNTER — Ambulatory Visit
Admission: RE | Admit: 2024-01-21 | Discharge: 2024-01-21 | Disposition: A | Discharge status: Home / Self Care | Source: Ambulatory Visit | Attending: Surgery | Admitting: Surgery

## 2024-01-21 ENCOUNTER — Encounter (HOSPITAL_COMMUNITY)
Admission: RE | Disposition: A | Discharge status: Home / Self Care | Payer: Self-pay | Source: Ambulatory Visit | Attending: Surgery

## 2024-01-21 ENCOUNTER — Other Ambulatory Visit: Payer: Self-pay

## 2024-01-21 ENCOUNTER — Encounter (HOSPITAL_COMMUNITY): Payer: Self-pay | Admitting: Surgery

## 2024-01-21 DIAGNOSIS — J449 Chronic obstructive pulmonary disease, unspecified: Secondary | ICD-10-CM | POA: Insufficient documentation

## 2024-01-21 DIAGNOSIS — R194 Change in bowel habit: Secondary | ICD-10-CM | POA: Insufficient documentation

## 2024-01-21 DIAGNOSIS — E039 Hypothyroidism, unspecified: Secondary | ICD-10-CM | POA: Insufficient documentation

## 2024-01-21 DIAGNOSIS — F32A Depression, unspecified: Secondary | ICD-10-CM | POA: Insufficient documentation

## 2024-01-21 DIAGNOSIS — E785 Hyperlipidemia, unspecified: Secondary | ICD-10-CM | POA: Insufficient documentation

## 2024-01-21 DIAGNOSIS — K5730 Diverticulosis of large intestine without perforation or abscess without bleeding: Secondary | ICD-10-CM | POA: Insufficient documentation

## 2024-01-21 DIAGNOSIS — F419 Anxiety disorder, unspecified: Secondary | ICD-10-CM | POA: Insufficient documentation

## 2024-01-21 DIAGNOSIS — K6289 Other specified diseases of anus and rectum: Secondary | ICD-10-CM | POA: Insufficient documentation

## 2024-01-21 DIAGNOSIS — K449 Diaphragmatic hernia without obstruction or gangrene: Secondary | ICD-10-CM | POA: Insufficient documentation

## 2024-01-21 DIAGNOSIS — F17210 Nicotine dependence, cigarettes, uncomplicated: Secondary | ICD-10-CM | POA: Insufficient documentation

## 2024-01-21 DIAGNOSIS — M539 Dorsopathy, unspecified: Secondary | ICD-10-CM | POA: Insufficient documentation

## 2024-01-21 DIAGNOSIS — K648 Other hemorrhoids: Secondary | ICD-10-CM | POA: Insufficient documentation

## 2024-01-21 DIAGNOSIS — Z859 Personal history of malignant neoplasm, unspecified: Secondary | ICD-10-CM | POA: Insufficient documentation

## 2024-01-21 DIAGNOSIS — Z8719 Personal history of other diseases of the digestive system: Secondary | ICD-10-CM | POA: Insufficient documentation

## 2024-01-21 DIAGNOSIS — M5382 Other specified dorsopathies, cervical region: Secondary | ICD-10-CM | POA: Insufficient documentation

## 2024-01-21 HISTORY — DX: Chronic obstructive pulmonary disease, unspecified: J44.9

## 2024-01-21 SURGERY — COLONOSCOPY
Anesthesia: General | Wound class: Clean Contaminated Wounds-The respiratory, GI, Genital, or urinary

## 2024-01-21 MED ORDER — LACTATED RINGERS INTRAVENOUS SOLUTION
INTRAVENOUS | Status: DC | PRN
Start: 2024-01-21 — End: 2024-01-21

## 2024-01-21 MED ORDER — PROPOFOL 10 MG/ML IV BOLUS
INJECTION | Freq: Once | INTRAVENOUS | Status: DC | PRN
Start: 2024-01-21 — End: 2024-01-21
  Administered 2024-01-21 (×3): 50 mg via INTRAVENOUS

## 2024-01-21 SURGICAL SUPPLY — 1 items: CLEANER INSTRUMENT PRE-KLENZ 13.5 OZ (MISCELLANEOUS PT CARE ITEMS) ×1 IMPLANT

## 2024-01-21 NOTE — Anesthesia Preprocedure Evaluation (Signed)
 ANESTHESIA PRE-OP EVALUATION  Planned Procedure: COLONOSCOPY  Review of Systems     anesthesia history negative     patient summary reviewed  nursing notes reviewed        Pulmonary   COPD, current smoker and Denies smoking in last 24  hours,   Cardiovascular    Hyperlipidemia , Exercise Tolerance: > or = 4 METS        GI/Hepatic/Renal    hiatal hernia        Endo/Other    hypothyroidism and diverticulitis,      Neuro/Psych/MS    back abnormality, anxiety, depression, Neck problems     Cancer  CA,                 Physical Assessment      Airway       Mallampati: I    TM distance: >3 FB    Neck ROM: full  Mouth Opening: good.  No Facial hair  No Beard        Dental           (+) upper dentures, lower dentures        Comment: Dentures Removed       Pulmonary    Breath sounds clear to auscultation       Cardiovascular    Rhythm: regular  Rate: Normal       Other findings          Plan  ASA 2     Planned anesthesia type: general     total intravenous anesthesia                    Intravenous induction       Anesthetic plan and risks discussed with patient  signed consent obtained          Patient's NPO status is appropriate for Anesthesia.

## 2024-01-21 NOTE — OR Surgeon (Signed)
 [facility]      Patient Name: Crystal Ballard, Crystal Ballard  Hospital Number: [id number]  Date of Service: 01/21/2024   Date of Birth: 05-13-1946      Pre-Operative Diagnosis: Rectal Pain  Change in Bowel Habits     Post-Operative Diagnosis: Sigmoid Diverticulosis  Internal Hemorrhoids    Procedure(s)/Description:  COLONOSCOPY: 54098 (CPT)       Attending Surgeon: Verl Glatter, MD     Anesthesia:  CRNA: Katheleen Palmer, CRNA    Anesthesia Type: .General     Specimens Removed:none      Patient was taken to the endoscopy suite and given appropriate intravenous sedation.  Videocolonoscope was inserted into the rectum and advanced sequentially to the level of the cecum.  Cecum was confirmed by external palpation, presence of the ileocecal valve and transillumination of the light.  Picture was taken that documents this level.  Colonoscope was then subsequently withdrawn back inspecting all mucosal surfaces.  The ascending colon, transverse colon, and descending colon were all visualized with no specific abnormality.  The colonoscope was withdrawn back to the level of the sigmoid colon noting changes of diverticulosis at that level.  No other specific abnormality was noted.  The scope was withdrawn back to the level of the rectum and retroflexed.  The rectal anal junction visualized with internal hemorrhoids and no other specific abnormality.  Scope was then subsequently straightened and withdrawn.  This concluded the procedure which patient tolerated well.  Alica Inks MD MBA CPE FACS

## 2024-01-21 NOTE — Discharge Instructions (Addendum)
 SURGICAL DISCHARGE INSTRUCTIONS     Dr. Esmond Plants, MD  performed your COLONOSCOPY today at the [facility]  Findings;    Sigmoid Diverticulosis  Internal Hemorrhoids     [facility]:  Monday through Friday from 8 a.m. - 4 p.m.: [phone]    For T&D: [phone]  Between 4 p.m. - 8 a.m., weekends and holidays:  Call ER [phone]    PLEASE SEE WRITTEN HANDOUTS AS DISCUSSED BY YOUR NURSE:  Sunday Spillers    SIGNS AND SYMPTOMS OF A WOUND / INCISION INFECTION   Be sure to watch for the following:  Increase in redness or red streaks near or around the wound or incision.  Increase in pain that is intense or severe and cannot be relieved by the pain medication that your doctor has given you.  Increase in swelling that cannot be relieved by elevation of a body part, or by applying ice, if permitted.  Increase in drainage, or if yellow / green in color and smells bad. This could be on a dressing or a cast.  Increase in fever for longer than 24 hours, or an increase that is higher than 101 degrees Fahrenheit (normal body temperature is 98 degrees Fahrenheit). The incision may feel warm to the touch.    **CALL YOUR DOCTOR IF ONE OR MORE OF THESE SIGNS / SYMPTOMS SHOULD OCCUR.    ANESTHESIA INFORMATION   ANESTHESIA -- ADULT PATIENTS:  You have received intravenous sedation / general anesthesia, and you may feel drowsy and light-headed for several hours. You may even experience some forgetfulness of the procedure. DO NOT DRIVE A MOTOR VEHICLE or perform any activity requiring complete alertness or coordination until you feel fully awake in about 24-48 hours. Do not drink alcoholic beverages for at least 24 hours. Do not stay alone, you must have a responsible adult available to be with you. You may also experience a dry mouth or nausea for 24 hours. This is a normal side effect and will disappear as the effects of the medication wear off.    REMEMBER   If you experience any  difficulty breathing, chest pain, bleeding that you feel is excessive, persistent nausea or vomiting or for any other concerns:  Call your physician Dr.  Esmond Plants, MD   at [phone] . You may also ask to have the general doctor on call paged. They are available to you 24 hours a day.      SPECIAL INSTRUCTIONS / COMMENTS   No driving,cooking, cleaning or operating machinery today, you may resume normal activities tomorrow. You may eat a regular diet as tolerated today.    FOLLOW-UP APPOINTMENTS   Follow-up as needed with Dr.Berkovich.

## 2024-01-21 NOTE — Anesthesia Postprocedure Evaluation (Signed)
 Anesthesia Post Op Evaluation    Patient: Crystal Ballard  Procedure(s):  COLONOSCOPY    Last Vitals:Temperature: 36.2 C (97.2 F) (01/21/24 1239)  Heart Rate: 89 (01/21/24 1239)  BP (Non-Invasive): 108/63 (01/21/24 1239)  Respiratory Rate: 15 (01/21/24 1239)  SpO2: 98 % (01/21/24 1239)    No notable events documented.    Patient is sufficiently recovered from the effects of anesthesia to participate in the evaluation and has returned to their pre-procedure level.  Patient location during evaluation: PACU       Patient participation: complete - patient participated  Level of consciousness: awake and alert and responsive to verbal stimuli  Multimodal Pain Management: Medical reason for not using multimodal pain management  Pain score: 0  Pain management: adequate  Airway patency: patent    Anesthetic complications: no  Cardiovascular status: acceptable  Respiratory status: acceptable  Hydration status: acceptable  Patient post-procedure temperature: Pt Normothermic   PONV Status: Absent

## 2024-01-22 ENCOUNTER — Other Ambulatory Visit (INDEPENDENT_AMBULATORY_CARE_PROVIDER_SITE_OTHER): Payer: Self-pay

## 2024-01-22 ENCOUNTER — Telehealth (INDEPENDENT_AMBULATORY_CARE_PROVIDER_SITE_OTHER): Payer: Self-pay | Admitting: Surgery

## 2024-01-22 NOTE — H&P (Signed)
 [facility]  [address]  [place] [address]       Name: Crystal Ballard MRN:  [id number]   Date: 12/21/2023 Age: 78 y.o. 21-Jan-1946      PCP: [facility], APRN,NP-C     Subjective  Crystal Ballard is a 78 y.o. year old female who presents for worsening rectal pain.  Associated with worsening constipation.  Has mucus that comes with bowel movements as well as between.  Sensation of tenesmus or incomplete emptying with bowel movements as well.  All of the symptoms have just started in the past 3-4 months  No abdominal pain.  No rectal bleeding.  No unexplained weight loss.      Family history of colon cancer:  No    Last colonoscopy:  4-5 years ago    Patient has had multiple prior back interventions.  Has not been told that she has a any cord compression or other findings that would cause any anorectal symptoms.         Allergies   Allergen Reactions    Pravastatin Myalgia and  Other Adverse Reaction (Add comment)     Foot pain at 20mg     Zetia  [Ezetimibe ] Myalgia      Current Outpatient Medications   Medication Sig    busPIRone  (BUSPAR ) 5 mg Oral Tablet Take 1 Tablet (5 mg total) by mouth Three times a day as needed    calcium carbonate/vitamin D3 (VITAMIN D -3 ORAL) Take 500 mg by mouth Once a day    citalopram  (CELEXA ) 10 mg Oral Tablet Take 1 Tablet (10 mg total) by mouth Daily for 7 days, THEN 0.5 Tablets (5 mg total) Daily for 7 days.    docusate sodium (COLACE) 100 mg Oral Capsule Take 1 Capsule (100 mg total) by mouth One time    hydrOXYzine  HCL (ATARAX ) 25 mg Oral Tablet Take 1-2 Tablets (25-50 mg total) by mouth Every evening (Patient not taking: Reported on 01/21/2024)    Mirtazapine  (REMERON ) 7.5 mg Oral Tablet Take 1 Tablet (7.5 mg total) by mouth Every night (Patient not taking: Reported on 01/21/2024)    omeprazole  (PRILOSEC) 40 mg Oral Capsule, Delayed Release(E.C.) Take 1 Capsule (40 mg total) by mouth Daily          Objective:       Vitals:    01/21/24  1100 01/21/24 1239 01/21/24 1254 01/21/24 1309   BP: (!) 116/56 108/63 118/62 112/68   Pulse: 65 89 65 61   Resp: 19 15 16 14    Temp: 36.1 C (97 F) 36.2 C (97.2 F)     SpO2: 100% 98% 94% 96%   Weight: 56.7 kg (125 lb)      Height: 1.6 m (5' 3)      BMI: 22.14           General: appropriate for age. in no acute distress.    Vital signs are present above and have been reviewed by me     HEENT: Atraumatic, Normocephalic.    Lungs: Nonlabored breathing with symmetric expansion    Heart:Regular wth respect to rate and rythmn.    Abdomen:Soft. Nontender. Nondistended     Psychiatric: Alert and oriented to person, place, and time. affect appropriate    Discussed indications, risks, and benefits of colonoscopy with the patient.  Discussed the possibility of polypectomy, biopsies, and repeat possible examinations.  Risks discussed include bleeding, sedation risks, possibility of missed diagnosis of polyp malignancy, and remote  possibility of perforation and/or death.  All questions answered and informed consent clearly obtained.        Alm DELENA Nam MD MBA CPE FACS

## 2024-01-25 ENCOUNTER — Encounter (INDEPENDENT_AMBULATORY_CARE_PROVIDER_SITE_OTHER): Payer: Self-pay

## 2024-01-25 ENCOUNTER — Ambulatory Visit: Payer: Self-pay

## 2024-01-25 ENCOUNTER — Other Ambulatory Visit: Payer: Self-pay

## 2024-01-25 VITALS — BP 119/70 | HR 74 | Temp 97.9°F | Resp 16 | Ht 63.0 in | Wt 131.2 lb

## 2024-01-25 DIAGNOSIS — E785 Hyperlipidemia, unspecified: Secondary | ICD-10-CM | POA: Insufficient documentation

## 2024-01-25 DIAGNOSIS — F325 Major depressive disorder, single episode, in full remission: Secondary | ICD-10-CM | POA: Insufficient documentation

## 2024-01-25 DIAGNOSIS — G4700 Insomnia, unspecified: Secondary | ICD-10-CM | POA: Insufficient documentation

## 2024-01-25 DIAGNOSIS — G2581 Restless legs syndrome: Secondary | ICD-10-CM | POA: Insufficient documentation

## 2024-01-25 MED ORDER — OMEPRAZOLE 40 MG CAPSULE,DELAYED RELEASE
40.0000 mg | DELAYED_RELEASE_CAPSULE | Freq: Every day | ORAL | 1 refills | Status: DC
Start: 2024-01-25 — End: 2024-04-13

## 2024-01-25 MED ORDER — BUSPIRONE 5 MG TABLET
5.0000 mg | ORAL_TABLET | Freq: Three times a day (TID) | ORAL | 1 refills | Status: DC | PRN
Start: 2024-01-25 — End: 2024-03-24

## 2024-01-25 MED ORDER — ROPINIROLE 0.25 MG TABLET
0.2500 mg | ORAL_TABLET | Freq: Every evening | ORAL | 1 refills | Status: DC
Start: 2024-01-25 — End: 2024-01-28

## 2024-01-25 NOTE — Assessment & Plan Note (Signed)
 Patient has successfully tapered off Celexa  as we discussed last office visit. She denies any lingering depression symptoms.

## 2024-01-25 NOTE — Assessment & Plan Note (Signed)
 Lab Results   Component Value Date    CHOLESTEROL 213 (H) 07/15/2023    HDLCHOL 66 07/15/2023    LDLCHOL 124 (H) 07/15/2023    TRIG 116 07/15/2023   She denies compliance with Zetia  stating that it caused her legs to hurt. Patient advised to complete the lab work that was previously ordered.

## 2024-01-25 NOTE — Assessment & Plan Note (Signed)
 Patient advised on the available treatment options. Requip order placed at the beginning titration dose. She was instructed to contact our office in a few days and let us  know if it is working or not to discuss dose adjustments.

## 2024-01-25 NOTE — Progress Notes (Signed)
 PRN MEDICAL OFFICE [facility]  FAMILY MEDICINE, MEDICAL OFFICE BUILDING  [address]  [place] [address]  [phone]   COREN CROWNOVER  October 11, 1945  [id number]    Date of Service: 01/25/2024  Chief complaint:   Chief Complaint   Patient presents with    Follow Up 3 Months     Subjective:   Crystal Ballard is a 78 y.o. female and presents today for follow-up on chronic disease management, medication refills and lab work. Patient endorses compliance with her medication summary. She verbalizes acute complaint of tingling in bilateral feet that has caused her to be losing sleep. She states that it usually only happens at night and makes her get up out of bed and pace the floor. She denies any other acute medical complaints.     Past Medical History:   Diagnosis Date    Anxiety with depression     Chronic obstructive airway disease     Diverticulitis     Hiatal hernia     Hypothyroidism     Insomnia      Past Surgical History:   Procedure Laterality Date    COLONOSCOPY      HX HYSTERECTOMY      HX WISDOM TEETH EXTRACTION      SHOULDER SURGERY      SPINE SURGERY       Current Outpatient Medications   Medication Sig Dispense Refill    busPIRone  (BUSPAR ) 5 mg Oral Tablet Take 1 Tablet (5 mg total) by mouth Three times a day as needed 90 Tablet 1    calcium carbonate/vitamin D3 (VITAMIN D -3 ORAL) Take 500 mg by mouth Once a day      docusate sodium (COLACE) 100 mg Oral Capsule Take 1 Capsule (100 mg total) by mouth One time      magnesium oxide 200 Oral Tablet Take 1 Tablet (200 mg total) by mouth Twice daily      omeprazole  (PRILOSEC) 40 mg Oral Capsule, Delayed Release(E.C.) Take 1 Capsule (40 mg total) by mouth Daily 90 Capsule 1    rOPINIRole (REQUIP) 0.25 mg Oral Tablet Take 1 Tablet (0.25 mg total) by mouth Every night Indications: restless legs syndrome, an extreme discomfort in the calf muscles when sitting or lying down 90 Tablet 1     No current facility-administered medications for this visit.      Allergies[1]    Review of Systems:  Any pertinent Review of Systems as addressed in the HPI above.    Objective:   BP 119/70   Pulse 74   Temp 36.6 C (97.9 F) (Temporal)   Resp 16   Ht 1.6 m (5' 3)   Wt 59.5 kg (131 lb 3.2 oz)   SpO2 95%   BMI 23.24 kg/m      Physical Exam  Vitals reviewed.   Constitutional:       General: She is not in acute distress.     Appearance: Normal appearance.   HENT:      Nose: Nose normal.      Mouth/Throat:      Mouth: Mucous membranes are moist.   Eyes:      Pupils: Pupils are equal, round, and reactive to light.   Cardiovascular:      Rate and Rhythm: Normal rate and regular rhythm.      Pulses: Normal pulses.      Heart sounds: Normal heart sounds.   Pulmonary:      Effort: Pulmonary effort is  normal.      Breath sounds: Normal breath sounds.   Abdominal:      General: Abdomen is flat. Bowel sounds are normal.      Palpations: Abdomen is soft.   Musculoskeletal:         General: Normal range of motion.      Cervical back: Normal range of motion.   Skin:     General: Skin is warm and dry.      Capillary Refill: Capillary refill takes less than 2 seconds.   Neurological:      General: No focal deficit present.      Mental Status: She is alert and oriented to person, place, and time. Mental status is at baseline.   Psychiatric:         Attention and Perception: Attention normal.         Mood and Affect: Mood and affect normal.         Speech: Speech normal.         Behavior: Behavior normal. Behavior is cooperative.         Thought Content: Thought content normal.    Laboratory:  COMPLETE BLOOD COUNT   Lab Results   Component Value Date    WBC 6.4 12/28/2023    HGB 14.3 12/28/2023    HCT 42.9 (H) 12/28/2023    PLTCNT 125 (L) 12/28/2023     DIFFERENTIAL  Lab Results   Component Value Date    PMNS 65 12/28/2023    LYMPHOCYTES 25 12/28/2023    MONOCYTES 9 12/28/2023    EOSINOPHIL 1 12/28/2023    BASOPHILS 0 12/28/2023    BASOPHILS 0.02 12/28/2023    PMNABS 4.18 12/28/2023     LYMPHSABS 1.59 12/28/2023    EOSABS 0.05 12/28/2023    MONOSABS 0.61 12/28/2023     BASIC METABOLIC PANEL  Lab Results   Component Value Date    SODIUM 142 12/28/2023    POTASSIUM 4.4 12/28/2023    CHLORIDE 106 12/28/2023    CO2 30 12/28/2023    ANIONGAP 6 12/28/2023    BUN 11 12/28/2023    CREATININE 0.65 12/28/2023    BUNCRRATIO 17 12/28/2023    GFR 91 12/28/2023    CALCIUM 9.5 12/28/2023    GLUCOSE Negative 12/28/2023    GLUCOSENF 104 12/28/2023      LIPID PROFILE  Lab Results   Component Value Date    CHOLESTEROL 213 (H) 07/15/2023    HDLCHOL 66 07/15/2023    LDLCHOL 124 (H) 07/15/2023    TRIG 116 07/15/2023     LIVER TESTS  Lab Results   Component Value Date    ALBUMIN 3.9 12/28/2023    AST 19 12/28/2023    ALT 20 12/28/2023    ALKPHOS 72 12/28/2023    TOTBILIRUBIN 0.5 12/28/2023    BILIRUBINCON <0.10 07/15/2023     THYROID  STIMULATING HORMONE  Lab Results   Component Value Date    TSH 1.009 07/15/2023     VITAMIN B12  Lab Results   Component Value Date    VITB12 314 12/30/2023   Most recent lab results reviewed with patient.  Assessment & Plan  Major depression in remission (CMS [facility])  Patient has successfully tapered off Celexa  as we discussed last office visit. She denies any lingering depression symptoms.  Hyperlipidemia, unspecified hyperlipidemia type  Lab Results   Component Value Date    CHOLESTEROL 213 (H) 07/15/2023    HDLCHOL 66 07/15/2023    LDLCHOL 124 (  H) 07/15/2023    TRIG 116 07/15/2023   She denies compliance with Zetia  stating that it caused her legs to hurt. Patient advised to complete the lab work that was previously ordered.   Restless leg syndrome  Insomnia, unspecified type  Patient advised on the available treatment options. Requip order placed at the beginning titration dose. She was instructed to contact our office in a few days and let us  know if it is working or not to discuss dose adjustments.    Counseling with regards to preventative care given. Medication summary was discussed  with the patient to verify compliance and understanding. Medication safety was discussed. A good faith effort was made to reconcile the patient's medications. The patient was given the opportunity to ask questions and those questions were answered to the patient's satisfaction. The patient was encouraged to call with any additional questions or concerns. More than 50% of the visit was spent counseling and coordinating care.    Orders Placed This Encounter    MAGNESIUM    rOPINIRole (REQUIP) 0.25 mg Oral Tablet    busPIRone  (BUSPAR ) 5 mg Oral Tablet    omeprazole  (PRILOSEC) 40 mg Oral Capsule, Delayed Release(E.C.)     Return in about 3 months (around 04/26/2024).  [facility], APRN,NP-C 01/25/2024, 11:04         [1]   Allergies  Allergen Reactions    Pravastatin Myalgia and  Other Adverse Reaction (Add comment)     Foot pain at 20mg     Zetia  [Ezetimibe ] Myalgia
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
## 2024-01-28 ENCOUNTER — Telehealth (INDEPENDENT_AMBULATORY_CARE_PROVIDER_SITE_OTHER): Payer: Self-pay

## 2024-01-28 ENCOUNTER — Other Ambulatory Visit (INDEPENDENT_AMBULATORY_CARE_PROVIDER_SITE_OTHER): Payer: Self-pay

## 2024-01-28 MED ORDER — ROPINIROLE 0.5 MG TABLET
0.5000 mg | ORAL_TABLET | Freq: Every evening | ORAL | 0 refills | Status: DC
Start: 2024-01-28 — End: 2024-02-08

## 2024-01-28 NOTE — Nursing Note (Signed)
 Received call from patient stating that she was advised by provider on Monday that if the current dose of her Requip was not effective to let him know. Patient states she has not had any relief thus far.

## 2024-01-28 NOTE — Telephone Encounter (Signed)
 Patient informed. Voices understanding and agreement.

## 2024-01-29 ENCOUNTER — Other Ambulatory Visit (INDEPENDENT_AMBULATORY_CARE_PROVIDER_SITE_OTHER): Payer: Self-pay

## 2024-01-29 ENCOUNTER — Telehealth (INDEPENDENT_AMBULATORY_CARE_PROVIDER_SITE_OTHER): Payer: Self-pay

## 2024-01-29 ENCOUNTER — Encounter (INDEPENDENT_AMBULATORY_CARE_PROVIDER_SITE_OTHER): Payer: Self-pay

## 2024-01-29 MED ORDER — ALENDRONATE 70 MG TABLET
70.0000 mg | ORAL_TABLET | ORAL | 0 refills | Status: AC
Start: 2024-01-29 — End: 2024-04-28

## 2024-01-29 NOTE — Telephone Encounter (Signed)
-----   Message from [facility], [place] sent at 01/29/2024 10:16 AM EDT -----  Please advise patient on recommendation of Fosamax 70 mg weekly in addition to calcium and vitamin D  supplementation.  ----- Message -----  From: Kosinar, Melissa, LPN  Sent: 3/73/7974  12:06 PM EDT  To: [facility], APRN,NP-C    Patient had a bone density 11/06/23. She has hx of fracture and for that reason even though it's Osteopenia she won't fall off the measure.     NOF Guidelines recommend treatment for patients with a T-score of -1.5 and below with risk factors of -2.0 and below without risk factors. Effective therapies are available in the form of bisphosphonates (Fosamax and Actonel).     Please advise.

## 2024-01-29 NOTE — Telephone Encounter (Signed)
 Vmail left to return call.

## 2024-01-29 NOTE — Telephone Encounter (Signed)
 Patient informed. Voices understanding and agreement. Orders placed.

## 2024-02-03 ENCOUNTER — Telehealth (INDEPENDENT_AMBULATORY_CARE_PROVIDER_SITE_OTHER): Payer: Self-pay

## 2024-02-03 NOTE — Nursing Note (Signed)
 Received call from patient stating medication for leg cramps is not working and something else needs to be tried

## 2024-02-08 ENCOUNTER — Other Ambulatory Visit (INDEPENDENT_AMBULATORY_CARE_PROVIDER_SITE_OTHER): Payer: Self-pay

## 2024-02-08 MED ORDER — ROPINIROLE 1 MG TABLET
1.0000 mg | ORAL_TABLET | Freq: Every evening | ORAL | 0 refills | Status: DC
Start: 2024-02-08 — End: 2024-03-09

## 2024-02-08 NOTE — Telephone Encounter (Signed)
 Vmail left to return call.

## 2024-02-09 NOTE — Telephone Encounter (Signed)
 Patient informed. Patient states that provider had increased to 2mg  and it had not helped but states she has an appt tomorrow and she will discuss it at that time because she thinks the pain is coming from her hips

## 2024-02-10 ENCOUNTER — Other Ambulatory Visit: Payer: Self-pay

## 2024-02-10 ENCOUNTER — Ambulatory Visit: Payer: Self-pay

## 2024-02-10 ENCOUNTER — Encounter (INDEPENDENT_AMBULATORY_CARE_PROVIDER_SITE_OTHER): Payer: Self-pay

## 2024-02-10 VITALS — BP 132/82 | HR 75 | Temp 97.8°F | Resp 14 | Ht 63.0 in | Wt 127.0 lb

## 2024-02-10 DIAGNOSIS — M545 Low back pain, unspecified: Secondary | ICD-10-CM | POA: Insufficient documentation

## 2024-02-10 DIAGNOSIS — G2581 Restless legs syndrome: Secondary | ICD-10-CM | POA: Insufficient documentation

## 2024-02-10 DIAGNOSIS — G8929 Other chronic pain: Secondary | ICD-10-CM | POA: Insufficient documentation

## 2024-02-10 MED ORDER — ROPINIROLE 1 MG TABLET
1.0000 mg | ORAL_TABLET | Freq: Every evening | ORAL | 0 refills | Status: DC
Start: 2024-02-10 — End: 2024-03-25

## 2024-02-10 MED ORDER — CELECOXIB 100 MG CAPSULE
100.0000 mg | ORAL_CAPSULE | Freq: Two times a day (BID) | ORAL | 0 refills | Status: DC
Start: 2024-02-10 — End: 2024-03-25

## 2024-02-10 NOTE — Assessment & Plan Note (Signed)
 Requip  0.5 mg QHS ineffective. Patient had been instructed to increase dose to 1 mg QHS a few days ago, but has not yet changed dose. Refill order changed to patient's preferred pharmacy.
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
## 2024-02-10 NOTE — Progress Notes (Signed)
 FAMILY MEDICINE, MEDICAL OFFICE BUILDING  [address]  [place] [address]  Operated by [facility]    Symiah M Ancheta  1945/12/28  [id number]    Date of Service: 02/10/2024  9:30 AM EDT    Chief complaint:   Chief Complaint   Patient presents with    Follow Up     Subjective:   This is a case of a 78 y.o. female who presents today for complaint(s) of: Acute on chronic back pain. Patient requesting an MRI for what she believes to be arthritis. She states that imaging was done years ago that said she would eventually need a left hip replacement, but she denies any follow-up after that. Patient claims moderate symptom improvement with OTC Ibuprofen. She also would like to discuss the restless leg syndrome, stating that the 0.5 mg QHS Requip  is ineffective and she thinks that she might need a different medication.    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    Carpal tunnel syndrome, left upper limb 09/24/2018    Cervical spondylosis 09/24/2017    DDD (degenerative disc disease), lumbar 03/25/2022    Major depression in remission (CMS HCC) 01/17/2020    History of cervical cancer 10/23/2020    History of colon polyps 10/23/2020    Hyperlipidemia 01/17/2020    Thrombocytopenia (CMS HCC) 10/23/2020    Insomnia     Anxiety with depression     Restless leg syndrome 01/25/2024     Resolved Ambulatory Problems     Diagnosis Date Noted    Hypothyroidism      Past Medical History:   Diagnosis Date    Chronic obstructive airway disease     Diverticulitis     Hiatal hernia      Current Outpatient Medications   Medication Sig    alendronate  (FOSAMAX ) 70 mg Oral Tablet Take 1 Tablet (70 mg total) by mouth Every 7 days for 90 days    busPIRone  (BUSPAR ) 5 mg Oral Tablet Take 1 Tablet (5 mg total) by mouth Three times a day as needed    calcium carbonate/vitamin D3 (VITAMIN D -3 ORAL) Take 500 mg by mouth Once a day    celecoxib  (CELEBREX ) 100 mg  Oral Capsule Take 1 Capsule (100 mg total) by mouth Twice daily    docusate sodium (COLACE) 100 mg Oral Capsule Take 1 Capsule (100 mg total) by mouth One time    magnesium oxide 200 Oral Tablet Take 1 Tablet (200 mg total) by mouth Twice daily    omeprazole  (PRILOSEC) 40 mg Oral Capsule, Delayed Release(E.C.) Take 1 Capsule (40 mg total) by mouth Daily    rOPINIRole  (REQUIP ) 1 mg Oral Tablet Take 1 Tablet (1 mg total) by mouth Every night for 30 days     Review of Systems:  Any pertinent Review of Systems as addressed in the HPI above.    Objective   BP 132/82   Pulse 75   Temp 36.6 C (97.8 F) (Temporal)   Resp 14   Ht 1.6 m (5' 3)   Wt 57.6 kg (127 lb)   SpO2 96%   BMI 22.50 kg/m     Physical Exam  Vitals reviewed.   Constitutional:       General: She is not in acute distress.     Appearance: Normal appearance.   Cardiovascular:      Rate and Rhythm: Normal rate and regular  rhythm.      Pulses: Normal pulses.      Heart sounds: Normal heart sounds.   Pulmonary:      Effort: Pulmonary effort is normal.      Breath sounds: Normal breath sounds.   Abdominal:      General: Bowel sounds are normal.      Palpations: Abdomen is soft.   Skin:     General: Skin is warm and dry.      Capillary Refill: Capillary refill takes less than 2 seconds.   Neurological:      General: No focal deficit present.      Mental Status: She is alert and oriented to person, place, and time. Mental status is at baseline.      Gait: Gait abnormal (Antalgic gait).   Psychiatric:         Attention and Perception: Attention normal.         Mood and Affect: Mood and affect normal.         Speech: Speech normal.         Behavior: Behavior normal. Behavior is cooperative.         Thought Content: Thought content normal.        Assessment & Plan  Chronic lower back pain  Patient was advised on the available treatment options. Lumbar Xray and Celebrex  ordered.   Restless leg syndrome  Requip  0.5 mg QHS ineffective. Patient had been  instructed to increase dose to 1 mg QHS a few days ago, but has not yet changed dose. Refill order changed to patient's preferred pharmacy.    The patient was given the opportunity to ask questions and those questions were answered to the patient's satisfaction. The patient was encouraged to call with any additional questions or concerns. I instructed the patient to follow-up if symptoms persist or worsen. Medication safety was discussed. A good faith effort was made to reconcile the patient's medications. More than 50% of the visit was spent counseling and coordinating care.    Orders Placed This Encounter    XR LUMBAR SPINE AP/OBLIQUES/LAT/SPOT    rOPINIRole  (REQUIP ) 1 mg Oral Tablet    celecoxib  (CELEBREX ) 100 mg Oral Capsule     Follow up: Return if symptoms worsen or fail to improve.  [facility], APRN,NP-C

## 2024-02-12 ENCOUNTER — Other Ambulatory Visit: Payer: Self-pay

## 2024-02-12 ENCOUNTER — Ambulatory Visit
Admission: RE | Admit: 2024-02-12 | Discharge: 2024-02-12 | Disposition: A | Discharge status: Home / Self Care | Source: Ambulatory Visit

## 2024-02-12 DIAGNOSIS — M47816 Spondylosis without myelopathy or radiculopathy, lumbar region: Secondary | ICD-10-CM

## 2024-02-12 DIAGNOSIS — G8929 Other chronic pain: Secondary | ICD-10-CM | POA: Insufficient documentation

## 2024-02-12 DIAGNOSIS — M5459 Other low back pain: Secondary | ICD-10-CM

## 2024-02-12 DIAGNOSIS — M545 Low back pain, unspecified: Secondary | ICD-10-CM | POA: Insufficient documentation

## 2024-02-16 ENCOUNTER — Other Ambulatory Visit (INDEPENDENT_AMBULATORY_CARE_PROVIDER_SITE_OTHER): Payer: Self-pay

## 2024-02-16 ENCOUNTER — Ambulatory Visit (INDEPENDENT_AMBULATORY_CARE_PROVIDER_SITE_OTHER): Payer: Self-pay

## 2024-02-16 DIAGNOSIS — G8929 Other chronic pain: Secondary | ICD-10-CM

## 2024-03-19 ENCOUNTER — Ambulatory Visit (HOSPITAL_COMMUNITY): Payer: Self-pay

## 2024-03-22 ENCOUNTER — Ambulatory Visit
Admission: RE | Admit: 2024-03-22 | Discharge: 2024-03-22 | Disposition: A | Discharge status: Home / Self Care | Source: Ambulatory Visit

## 2024-03-22 ENCOUNTER — Other Ambulatory Visit: Payer: Self-pay

## 2024-03-22 DIAGNOSIS — G8929 Other chronic pain: Secondary | ICD-10-CM | POA: Insufficient documentation

## 2024-03-22 DIAGNOSIS — M545 Low back pain, unspecified: Secondary | ICD-10-CM | POA: Insufficient documentation

## 2024-03-24 ENCOUNTER — Other Ambulatory Visit (INDEPENDENT_AMBULATORY_CARE_PROVIDER_SITE_OTHER): Payer: Self-pay

## 2024-03-25 ENCOUNTER — Encounter (INDEPENDENT_AMBULATORY_CARE_PROVIDER_SITE_OTHER): Payer: Self-pay

## 2024-03-25 ENCOUNTER — Other Ambulatory Visit: Payer: Self-pay

## 2024-03-25 ENCOUNTER — Ambulatory Visit: Payer: Self-pay

## 2024-03-25 VITALS — BP 139/80 | HR 72 | Temp 97.9°F | Resp 16 | Ht 63.0 in | Wt 125.8 lb

## 2024-03-25 DIAGNOSIS — G629 Polyneuropathy, unspecified: Secondary | ICD-10-CM | POA: Insufficient documentation

## 2024-03-25 DIAGNOSIS — M51369 Other intervertebral disc degeneration, lumbar region without mention of lumbar back pain or lower extremity pain: Secondary | ICD-10-CM | POA: Insufficient documentation

## 2024-03-25 MED ORDER — GABAPENTIN 100 MG CAPSULE: 100.0000 mg | ORAL_CAPSULE | Freq: Three times a day (TID) | ORAL | 1 refills | Status: DC

## 2024-03-25 NOTE — Progress Notes (Signed)
 FAMILY MEDICINE, MEDICAL OFFICE BUILDING  [address]  [place] [address]  Operated by [facility]    Loyalty M Mamula  May 28, 1946  [id number]    Date of Service: 03/25/2024  9:00 AM EDT  Chief complaint:   Chief Complaint   Patient presents with    Follow-up After Testing     Doesn't feel like medications are helping     Subjective:   History of Present Illness    Crystal Ballard is a 78 year old female with degenerative disc disease and peripheral neuropathy who presents with worsening back and leg pain.    She has been experiencing worsening back and leg pain for about a month, described as a burning sensation radiating from her hips down to her legs, particularly affecting the big muscles. The severity of the pain disturbs her sleep, necessitating the use of over-the-counter pain relievers at night.    An MRI of the lumbosacral spine performed on March 22, 2024, revealed multilevel degenerative disc disease with multilevel recess encroachment. She has a history of low back pain radiating to her legs, with a previous MRI in 2022 indicating similar issues.    She has been taking Celebrex  for back pain, Fosamax  for osteopenia, and Caltrate. However, Celebrex  is not effective and she has stopped taking it. She also takes Buspar  as needed for anxiety, magnesium 200 mg twice a day, Prilosec for stomach issues, and occasionally Colace. She has run out of Requip , which was prescribed for restless leg syndrome, and reports it was not effective.    She experiences a burning and tingling sensation in her feet. She has tried various treatments, including using a pillow between her legs and applying heat, but these have not provided relief. She also mentions using Biofreeze, which offers temporary relief.    She has difficulty performing normal activities due to the pain and fatigue, often requiring a nap during the day. She has a history of leg cramping, which has improved, but the muscle pain  persists. No relief from current medications and treatments.        Review of Systems:  Any pertinent Review of Systems as addressed in the HPI above.    Objective   BP 139/80   Pulse 72   Temp 36.6 C (97.9 F) (Temporal)   Resp 16   Ht 1.6 m (5' 3)   Wt 57.1 kg (125 lb 12.8 oz)   SpO2 97%   BMI 22.28 kg/m     Physical Exam    CONSTITUTIONAL: Patient is not in acute distress. Normal appearance.  HENT: Mucous membranes are moist. Pupils are equal, round, and reactive to light.  CARDIOVASCULAR: Normal rate and regular rhythm.  Normal pulses.  Normal heart sounds. No murmur heard.  PULMONARY: Pulmonary effort is normal. Normal breath sounds.  ABDOMINAL: Bowel sounds are normal. Abdomen is soft and non-tender.  MUSCULOSKELETAL: No swelling or deformity. No tenderness. Right lower leg- No edema. Left lower leg- No edema.  LYMPHADENOPATHY: No cervical adenopathy.  SKIN: Skin is warm and dry.  NEUROLOGICAL: No focal deficit present. She is alert and oriented to person, place, and time.  PSYCHIATRIC: Mood and affect normal.     Results    RADIOLOGY  Lumbosacral spine MRI: Multilevel degenerative disc disease, no significant central stenosis, multilevel recess encroachment, moderate to severe left recess stenosis, unremarkable sacroiliac joints (03/22/2024)       Recent lab results reviewed and noted.      ICD-10-CM  1. DDD (degenerative disc disease), lumbar  M51.369       2. 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 MRI shows multilevel degenerative changes, moderate to severe left recess stenosis. Previous treatments ineffective. Neurontin  chosen for nerve pain management.  - Prescribe Neurontin  100 mg three times a day.  - Advise to avoid taking Celebrex  if taking ibuprofen.  - Discuss potential referral to orthopedic spine specialist  if conservative management fails.  - Discuss risks of Neurontin  including potential drowsiness.  - Advise to take ibuprofen with food and drink plenty of water to decrease risk of stomach ulcers.    Osteopenia  Osteopenia managed with Fosamax  and Caltrate. She reports running out of Fosamax .  - Ensure continuation of Fosamax  once a week.  - Continue Caltrate supplementation.    Gastroesophageal reflux disease (GERD)  GERD managed with Prilosec, effective.  - Continue Prilosec as prescribed.    Restless legs syndrome  Restless legs syndrome previously managed with Requip , ineffective.  - Discontinue Requip  due to ineffectiveness.    Anxiety disorder  Anxiety disorder managed with Buspar , taken as needed, primarily at night.  - Continue Buspar  as needed for anxiety.        The patient was given the opportunity to ask questions and those questions were answered to the patient's satisfaction. The patient was encouraged to call with any additional questions or concerns. I instructed the patient to follow-up if symptoms persist or worsen. Medication safety was discussed. A good faith effort was made to reconcile the patient's medications. More than 50% of the visit was spent counseling and coordinating care.    Orders Placed This Encounter    gabapentin  (NEURONTIN ) 100 mg Oral Capsule     Follow up: Return if symptoms worsen or fail to improve.  [facility], APRN,NP-C   This note was created with assistance from [employer] via capture of conversational audio. Consent was obtained from the patient and all parties present prior to recording.
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
## 2024-04-13 ENCOUNTER — Other Ambulatory Visit (INDEPENDENT_AMBULATORY_CARE_PROVIDER_SITE_OTHER): Payer: Self-pay

## 2024-04-13 MED ORDER — OMEPRAZOLE 40 MG CAPSULE,DELAYED RELEASE: 40.0000 mg | DELAYED_RELEASE_CAPSULE | Freq: Every day | ORAL | 1 refills | Status: DC

## 2024-04-20 ENCOUNTER — Other Ambulatory Visit (INDEPENDENT_AMBULATORY_CARE_PROVIDER_SITE_OTHER): Payer: Self-pay

## 2024-04-26 ENCOUNTER — Ambulatory Visit (INDEPENDENT_AMBULATORY_CARE_PROVIDER_SITE_OTHER): Payer: Self-pay

## 2024-05-03 ENCOUNTER — Encounter (INDEPENDENT_AMBULATORY_CARE_PROVIDER_SITE_OTHER): Payer: Self-pay

## 2024-05-03 ENCOUNTER — Other Ambulatory Visit: Payer: Self-pay

## 2024-05-03 ENCOUNTER — Ambulatory Visit: Payer: Self-pay

## 2024-05-03 VITALS — BP 120/69 | HR 74 | Temp 97.3°F | Wt 126.8 lb

## 2024-05-03 DIAGNOSIS — M5136 Other intervertebral disc degeneration, lumbar region with discogenic back pain only: Secondary | ICD-10-CM

## 2024-05-03 DIAGNOSIS — M51369 Other intervertebral disc degeneration, lumbar region without mention of lumbar back pain or lower extremity pain: Secondary | ICD-10-CM | POA: Insufficient documentation

## 2024-05-03 DIAGNOSIS — G2581 Restless legs syndrome: Secondary | ICD-10-CM

## 2024-05-03 DIAGNOSIS — G629 Polyneuropathy, unspecified: Secondary | ICD-10-CM | POA: Insufficient documentation

## 2024-05-03 DIAGNOSIS — E785 Hyperlipidemia, unspecified: Secondary | ICD-10-CM | POA: Insufficient documentation

## 2024-05-03 DIAGNOSIS — F325 Major depressive disorder, single episode, in full remission: Secondary | ICD-10-CM | POA: Insufficient documentation
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
## 2024-05-03 NOTE — Progress Notes (Signed)
 [facility] [place]  FAMILY MEDICINE, MEDICAL OFFICE BUILDING  [address]  [place] [address]  [phone]   Crystal Ballard  Jul 23, 1946  [id number]    Date of Service: 05/03/2024  Chief complaint:   Chief Complaint   Patient presents with    Follow Up     Subjective:   History of Present Illness    Crystal Ballard is a 78 year old female with degenerative disc disease and peripheral neuropathy who presents for a three month follow-up on chronic disease management.    She experiences ongoing back and leg pain, with a burning and tingling sensation in her feet. Previously, she was taking Celebrex  for back pain, but found it ineffective and switched to using Biofreeze, which also did not help. She has been taking Neurontin  only at bedtime due to concerns about addiction. Neurontin  has not significantly alleviated her pain, although it has reduced the frequency of her leg cramps.    She uses Tylenol for pain management, taking 500 mg once or twice a day as needed, and sometimes two pills at a time. She previously used ibuprofen but cannot recall the specifics of its use. She does not take Tylenol every day.    Her symptoms include localized back pain over her hips, which she identifies as bone pain, and notes that Neurontin  does not relieve this type of pain. She experiences tingling in her feet when sitting and tapping her heel, but not while walking or sleeping. She continues to experience numbness and tingling in her feet at night, although it does not disturb her sleep once she falls asleep.    Her medication history includes the discontinuation of Requip  and a cholesterol medication, which was suspected to cause leg cramps. She has not experienced leg cramps as frequently since stopping the cholesterol medication.       Past Medical History:   Diagnosis Date    Anxiety with depression     Chronic obstructive airway disease     Diverticulitis     Hiatal hernia     Hypothyroidism      Insomnia      Past Surgical History:   Procedure Laterality Date    COLONOSCOPY      HX HYSTERECTOMY      HX WISDOM TEETH EXTRACTION      SHOULDER SURGERY      SPINE SURGERY       Current Outpatient Medications   Medication Sig Dispense Refill    busPIRone  (BUSPAR ) 5 mg Oral Tablet Take 1 tablet by mouth three times daily as needed 90 Tablet 0    calcium carbonate/vitamin D3 (VITAMIN D -3 ORAL) Take 500 mg by mouth Once a day      docusate sodium (COLACE) 100 mg Oral Capsule Take 1 Capsule (100 mg total) by mouth One time      gabapentin  (NEURONTIN ) 100 mg Oral Capsule Take 1 Capsule (100 mg total) by mouth Three times a day 90 Capsule 1    magnesium oxide 200 Oral Tablet Take 1 Tablet (200 mg total) by mouth Twice daily      omeprazole  (PRILOSEC) 40 mg Oral Capsule, Delayed Release(E.C.) Take 1 Capsule (40 mg total) by mouth Daily 90 Capsule 1     No current facility-administered medications for this visit.     Allergies[1]    Review of Systems:  Any pertinent Review of Systems as addressed in the HPI above.    Objective:   BP 120/69  Pulse 74   Temp 36.3 C (97.3 F) (Temporal)   Wt 57.5 kg (126 lb 12.8 oz)   SpO2 96%   BMI 22.46 kg/m     BMI addressed: Advised on diet, weight loss, and exercise to reduce above normal BMI.     Physical Exam    CONSTITUTIONAL: Patient is not in acute distress. Normal appearance.  HENT: Mucous membranes are moist. Pupils are equal, round, and reactive to light.  CARDIOVASCULAR: Normal rate and regular rhythm. Normal pulses. Normal heart sounds. No murmur heard.  PULMONARY: Pulmonary effort is normal. Lungs clear to auscultation bilaterally.  ABDOMINAL: Bowel sounds are normal. Abdomen is soft and non-tender.  MUSCULOSKELETAL: No swelling or deformity. No tenderness. Right lower leg- No edema. Left lower leg- No edema.  LYMPHADENOPATHY: No cervical adenopathy.  SKIN: Skin is warm and dry.  NEUROLOGICAL: No focal deficit present. She is alert and oriented to person, place,  and time.  PSYCHIATRIC: Mood and affect normal.        Laboratory:  COMPLETE BLOOD COUNT   Lab Results   Component Value Date    WBC 6.4 12/28/2023    HGB 14.3 12/28/2023    HCT 42.9 (H) 12/28/2023    PLTCNT 125 (L) 12/28/2023     DIFFERENTIAL  Lab Results   Component Value Date    PMNS 65 12/28/2023    LYMPHOCYTES 25 12/28/2023    MONOCYTES 9 12/28/2023    EOSINOPHIL 1 12/28/2023    BASOPHILS 0 12/28/2023    BASOPHILS 0.02 12/28/2023    PMNABS 4.18 12/28/2023    LYMPHSABS 1.59 12/28/2023    EOSABS 0.05 12/28/2023    MONOSABS 0.61 12/28/2023     BASIC METABOLIC PANEL  Lab Results   Component Value Date    SODIUM 142 12/28/2023    POTASSIUM 4.4 12/28/2023    CHLORIDE 106 12/28/2023    CO2 30 12/28/2023    ANIONGAP 6 12/28/2023    BUN 11 12/28/2023    CREATININE 0.65 12/28/2023    BUNCRRATIO 17 12/28/2023    GFR 91 12/28/2023    CALCIUM 9.5 12/28/2023    GLUCOSE Negative 12/28/2023    GLUCOSENF 104 12/28/2023      LIPID PROFILE  Lab Results   Component Value Date    CHOLESTEROL 213 (H) 07/15/2023    HDLCHOL 66 07/15/2023    LDLCHOL 124 (H) 07/15/2023    TRIG 116 07/15/2023     LIVER TESTS  Lab Results   Component Value Date    ALBUMIN 3.9 12/28/2023    AST 19 12/28/2023    ALT 20 12/28/2023    ALKPHOS 72 12/28/2023    TOTBILIRUBIN 0.5 12/28/2023    BILIRUBINCON <0.10 07/15/2023     THYROID  STIMULATING HORMONE  Lab Results   Component Value Date    TSH 1.009 07/15/2023     VITAMIN B12  Lab Results   Component Value Date    VITB12 314 12/30/2023     Most recent results reviewed with patient.      ICD-10-CM    1. Hyperlipidemia, unspecified hyperlipidemia type  E78.5       2. 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 DDD (degenerative disc disease), lumbar  M51.369       4. Peripheral neuropathy  G62.9          Assessment & Plan     Degenerative disc disease with chronic pain  Chronic back pain managed  with Tylenol as needed. Neurontin  ineffective.  - Continue Tylenol as needed for severe pain.  - Monitor  liver function due to Tylenol use.    Peripheral neuropathy  Chronic tingling and numbness in feet. Neurontin  prescribed at 100 mg TID, currently taken only at bedtime. Discussed Neurontin 's controlled status and alternatives.  - Increase Neurontin  to 100 mg TID.  - Monitor for side effects such as grogginess.  - Consider alternative medications like Lyrica or Cymbalta if Neurontin  is ineffective.    Restless legs syndrome  Leg cramps decreased, possibly due to statin discontinuation. Neurontin  may help manage symptoms.  - Continue Neurontin  at increased dose of 100 mg TID.        Counseling with regards to preventative care given. Medication summary was discussed with the patient to verify compliance and understanding. Medication safety was discussed. A good faith effort was made to reconcile the patient's medications. The patient was given the opportunity to ask questions and those questions were answered to the patient's satisfaction. The patient was encouraged to call with any additional questions or concerns. More than 50% of the visit was spent counseling and coordinating care.    No orders of the defined types were placed in this encounter.    Return in about 3 months (around 08/02/2024).  [facility], APRN,NP-C 05/03/2024, 16:16  This note was created with assistance from [employer] via capture of conversational audio. Consent was obtained from the patient and all parties present prior to recording.         [1]   Allergies  Allergen Reactions    Pravastatin Myalgia and  Other Adverse Reaction (Add comment)     Foot pain at 20mg     Zetia  [Ezetimibe ] Myalgia

## 2024-05-03 NOTE — Nursing Note (Signed)
 05/03/24 1549   PHQ 9 (follow up)   Little interest or pleasure in doing things. 0   Feeling down, depressed, or hopeless 2   Trouble falling or staying asleep, or sleeping too much. 0   Feeling tired or having little energy 2   Poor appetite or overeating 3   Feeling bad about yourself/ that you are a failure in the past 2 weeks? 0   Trouble concentrating on things in the past 2 weeks? 0   Moving/Speaking slowly or being fidgety or restless  in the past 2 weeks? 0   Thoughts that you would be better off DEAD, or of hurting yourself in some way. 0   If you checked off any problems, how difficult have these problems made it for you to do your work, take care of things at home, or get along with other people? Not difficult at all   PHQ 9 Total 7   Interpretation of Total Score Mild depression

## 2024-05-14 ENCOUNTER — Ambulatory Visit (VACCINATION_CLINIC)

## 2024-05-14 ENCOUNTER — Other Ambulatory Visit: Payer: Self-pay

## 2024-05-14 ENCOUNTER — Ambulatory Visit
Admission: RE | Admit: 2024-05-14 | Discharge: 2024-05-14 | Disposition: A | Discharge status: Home / Self Care | Source: Ambulatory Visit

## 2024-05-14 DIAGNOSIS — Z87891 Personal history of nicotine dependence: Secondary | ICD-10-CM | POA: Insufficient documentation

## 2024-05-17 DIAGNOSIS — R918 Other nonspecific abnormal finding of lung field: Secondary | ICD-10-CM

## 2024-05-17 DIAGNOSIS — Z87891 Personal history of nicotine dependence: Secondary | ICD-10-CM

## 2024-05-17 DIAGNOSIS — Z122 Encounter for screening for malignant neoplasm of respiratory organs: Secondary | ICD-10-CM

## 2024-05-18 ENCOUNTER — Ambulatory Visit (INDEPENDENT_AMBULATORY_CARE_PROVIDER_SITE_OTHER): Payer: Self-pay

## 2024-05-20 ENCOUNTER — Encounter (INDEPENDENT_AMBULATORY_CARE_PROVIDER_SITE_OTHER): Payer: Self-pay

## 2024-05-23 ENCOUNTER — Ambulatory Visit

## 2024-05-23 ENCOUNTER — Encounter (INDEPENDENT_AMBULATORY_CARE_PROVIDER_SITE_OTHER): Payer: Self-pay

## 2024-05-23 ENCOUNTER — Other Ambulatory Visit: Payer: Self-pay

## 2024-05-23 VITALS — BP 135/79 | HR 73 | Temp 97.8°F | Resp 14 | Wt 125.4 lb

## 2024-05-23 DIAGNOSIS — R0989 Other specified symptoms and signs involving the circulatory and respiratory systems: Secondary | ICD-10-CM | POA: Insufficient documentation

## 2024-05-23 LAB — POCT RAPID COVID-19 & FLU (AMB ONLY)
COVID-19 AG: NEGATIVE
INFLUENZA TYPE A: NEGATIVE
INFLUENZA TYPE B: NEGATIVE

## 2024-05-23 MED ORDER — LIDOCAINE HCL 10 MG/ML (1 %) INJECTION SOLUTION
1.0000 g | INTRAMUSCULAR | Status: AC
Start: 2024-05-23 — End: 2024-05-23
  Administered 2024-05-23: 1 g via INTRAMUSCULAR

## 2024-05-23 MED ORDER — DEXAMETHASONE SODIUM PHOSPHATE 4 MG/ML INJECTION SOLUTION
4.0000 mg | INTRAMUSCULAR | Status: AC
Start: 2024-05-23 — End: 2024-05-23
  Administered 2024-05-23: 4 mg via INTRAMUSCULAR
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
## 2024-05-23 NOTE — Progress Notes (Signed)
 FAMILY MEDICINE, MEDICAL OFFICE BUILDING  [address]  [place] [address]  Operated by [facility] May Zima  11-29-1945  [id number]    Date of Service: 05/23/2024  3:30 PM EDT  Chief complaint:   Chief Complaint   Patient presents with    Feel Sick     Pt states she has congestion in chest, no fever, just feeling yucky      Subjective:   History of Present Illness    Crystal Ballard is a 78 year old female who presents with chest congestion and feeling unwell.    She has been experiencing chest congestion and feeling 'yucky' since Thursday, with symptoms progressively worsening. There are intermittent periods during the day when she feels better, but the symptoms return to her initial severity. No fever. She produces white, thick mucus. She has a history of recurrent respiratory infections and recalls receiving Decadron  and Rocephin in the past, which she believes were more effective than oral medications.    She has a history of smoking and acknowledges the need to quit.    She mentions experiencing occasional heart palpitations, describing them as her heart doing a 'flip' two or three times, but she has not sought medical attention for this issue.        Review of Systems:  Any pertinent Review of Systems as addressed in the HPI above.    Objective   BP 135/79   Pulse 73   Temp 36.6 C (97.8 F) (Temporal)   Resp 14   Wt 56.9 kg (125 lb 6.4 oz)   SpO2 94%   BMI 22.21 kg/m     Physical Exam    CONSTITUTIONAL: Patient is not in acute distress. Normal appearance.  HENT: Mucous membranes are moist. Pupils are equal, round, and reactive to light.  CARDIOVASCULAR: Normal rate and regular rhythm. Normal pulses. Two extrasystole heard. No murmur.  PULMONARY: Pulmonary effort is normal. Bilateral wheezes in the upper lobes.  MUSCULOSKELETAL: No swelling or deformity. No tenderness.  SKIN: Skin is warm and dry.  NEUROLOGICAL: No focal deficit present. Alert and oriented to  person, place, and time.  PSYCHIATRIC: Mood and affect normal.           ICD-10-CM    1. Chest congestion  R09.89 POCT Rapid Covid & Flu [place])         Assessment & Plan     Acute respiratory infection with wheezing  Acute respiratory infection with bilateral wheezing. History of similar episodes treated effectively with Decadron  and Rocephin.  - Administer Decadron  injection.  - Administer Rocephin.  - Perform swab test for COVID-19 and influenza.  - Call with swab results.  - Advise to return if symptoms worsen or persist.  - Keep follow-up appointment.    Tobacco use  Current smoker with mucus production.  - Advise smoking cessation.        The patient was given the opportunity to ask questions and those questions were answered to the patient's satisfaction. The patient was encouraged to call with any additional questions or concerns. I instructed the patient to follow-up if symptoms persist or worsen. Medication safety was discussed. A good faith effort was made to reconcile the patient's medications. More than 50% of the visit was spent counseling and coordinating care.    Orders Placed This Encounter    POCT Rapid Covid & Flu ([facility]of[facility])    dexAMETHasone  4 mg/mL injection    cefTRIAXone (ROCEPHIN) 1  g in lidocaine  2.86 mL (tot vol) IM injection     Follow up: Return if symptoms worsen or fail to improve.  [facility], APRN, CNP   This note was created with assistance from [employer] via capture of conversational audio. Consent was obtained from the patient and all parties present prior to recording.

## 2024-07-08 ENCOUNTER — Encounter (INDEPENDENT_AMBULATORY_CARE_PROVIDER_SITE_OTHER): Payer: Self-pay

## 2024-07-08 ENCOUNTER — Other Ambulatory Visit: Payer: Self-pay

## 2024-07-08 ENCOUNTER — Ambulatory Visit

## 2024-07-08 VITALS — BP 116/67 | HR 78 | Temp 98.1°F | Resp 14 | Wt 126.0 lb

## 2024-07-08 DIAGNOSIS — F17210 Nicotine dependence, cigarettes, uncomplicated: Secondary | ICD-10-CM

## 2024-07-08 DIAGNOSIS — J40 Bronchitis, not specified as acute or chronic: Secondary | ICD-10-CM | POA: Insufficient documentation

## 2024-07-08 MED ORDER — PREDNISONE 5 MG TABLET: ORAL_TABLET | ORAL | 0 refills | Status: DC

## 2024-07-08 MED ORDER — AMOXICILLIN 875 MG-POTASSIUM CLAVULANATE 125 MG TABLET: 1.0000 | ORAL_TABLET | Freq: Two times a day (BID) | ORAL | 0 refills | Status: DC

## 2024-07-08 MED ORDER — PROMETHAZINE-DM 6.25 MG-15 MG/5 ML ORAL SYRUP: 5.0000 mL | ORAL_SOLUTION | Freq: Four times a day (QID) | ORAL | 0 refills | Status: DC | PRN

## 2024-07-08 MED ORDER — ALBUTEROL SULFATE HFA 90 MCG/ACTUATION AEROSOL INHALER: 1.0000 | INHALATION_SPRAY | Freq: Four times a day (QID) | RESPIRATORY_TRACT | 2 refills | Status: AC | PRN

## 2024-07-08 MED ORDER — DEXAMETHASONE SODIUM PHOSPHATE 4 MG/ML INJECTION SOLUTION
4.0000 mg | INTRAMUSCULAR | Status: AC
  Administered 2024-07-08: 4 mg via INTRAMUSCULAR

## 2024-07-08 NOTE — Progress Notes (Signed)
 FAMILY MEDICINE, MEDICAL OFFICE BUILDING  [address]  [place] [address]  Operated by [facility] May Welsch  11-17-1945  [id number]    Date of Service: 07/08/2024 11:00 AM EST  Chief complaint:   Chief Complaint   Patient presents with    Feel Sick     Pt states she dont think she's got rid of her sickness. Cough,congestion, chills no fever, no appite, no fever, might be dehydrated. Thick white mucous she coughs up, gets nauseous. The coughing gets worse in the afternoon or if she gets up to do anything she gets hot and the cough worsens.      Subjective:   History of Present Illness    Crystal Ballard is a 78 year old female who presents with persistent cough and congestion.    She has persistent cough and congestion, described as 'sick congestion', with a history of similar symptoms from a previous illness. She experiences chills but no fever, and her appetite is poor, leading her to suspect dehydration. The cough worsens in the afternoon or with physical activity, and she feels hot when the cough intensifies.    The mucus is thick and white, causing nausea and sometimes leading her to attempt to vomit to clear it. She has a history of recurrent respiratory infections and recalls receiving Decadron  and Rocephin  in the past, which were effective, though she feels the mucus was not completely resolved with these treatments.    She was previously seen on October 20th for similar complaints and was swabbed for COVID-19 and influenza, both of which were negative. She has a significant smoking history of 33.5 pack years, currently smoking half a pack per day.    She has been using various over-the-counter cough syrups, including Vicks with honey, but has not been using Mucinex. She reports not drinking water regularly.    She denies any history of using an inhaler. She has borrowed a breathing machine from her daughter in the past but does not own one herself.        Review of  Systems:  Any pertinent Review of Systems as addressed in the HPI above.    Objective   BP 116/67   Pulse 78   Temp 36.7 C (98.1 F) (Temporal)   Resp 14   Wt 57.2 kg (126 lb)   SpO2 93%   BMI 22.32 kg/m     Physical Exam    CONSTITUTIONAL: Patient is not in acute distress. Normal appearance.  HENT: Mucous membranes are moist. Pupils are equal, round, and reactive to light.  CARDIOVASCULAR: Sinus rhythm with occasional extrasystole. Normal pulses. Normal heart sounds. No murmur heard.  PULMONARY: Wheezing present on auscultation. Pulmonary effort is normal.  SKIN: Skin is warm and dry.  NEUROLOGICAL: No focal deficit present. Alert and oriented to person, place, and time.  PSYCHIATRIC: Mood and affect normal.       ICD-10-CM    1. Bronchitis  J40          Assessment & Plan     Chronic bronchitis  Persistent cough, congestion, and thick white mucus. Symptoms unresponsive since October.  - Administered Decadron  shot.  - Prescribed Augmentin  875 mg twice daily for 7 days.  - Prescribed prednisone  taper: 5 mg three times a day for 3 days, twice a day for 3 days, once a day for 3 days.  - Prescribed promethazine  DM cough syrup.  - Advised increased water intake.  -  Recommended Mucinex for expectorant effect.  - Prescribed albuterol  inhaler.    Nicotine dependence, cigarettes  Long-standing nicotine dependence with 33.5 pack-years. Smoking cessation crucial for respiratory and overall health improvement.  - Advised smoking cessation.       The patient was given the opportunity to ask questions and those questions were answered to the patient's satisfaction. The patient was encouraged to call with any additional questions or concerns. I instructed the patient to follow-up if symptoms persist or worsen. Medication safety was discussed. A good faith effort was made to reconcile the patient's medications. More than 50% of the visit was spent counseling and coordinating care.    Orders Placed This Encounter     promethazine -dextromethorphan (PHENERGAN -DM) 6.25-15 mg/5 mL Oral Syrup    dexAMETHasone  4 mg/mL injection    amoxicillin -pot clavulanate (AUGMENTIN ) 875-125 mg Oral Tablet    albuterol  sulfate (PROVENTIL  OR VENTOLIN  OR PROAIR ) 90 mcg/actuation Inhalation oral inhaler    predniSONE  (DELTASONE ) 5 mg Oral Tablet     Tobacco cessation counseling performed.     Follow up: Return if symptoms worsen or fail to improve.  [facility], APRN, CNP   This note was created with assistance from [employer] via capture of conversational audio. Consent was obtained from the patient and all parties present prior to recording.
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
## 2024-07-18 ENCOUNTER — Ambulatory Visit: Payer: Self-pay

## 2024-07-18 ENCOUNTER — Encounter (INDEPENDENT_AMBULATORY_CARE_PROVIDER_SITE_OTHER): Payer: Self-pay

## 2024-07-18 ENCOUNTER — Other Ambulatory Visit (INDEPENDENT_AMBULATORY_CARE_PROVIDER_SITE_OTHER): Payer: Self-pay

## 2024-07-18 ENCOUNTER — Other Ambulatory Visit: Payer: Self-pay

## 2024-07-18 VITALS — BP 137/80 | HR 67 | Temp 97.8°F | Resp 18 | Ht 63.0 in | Wt 124.0 lb

## 2024-07-18 DIAGNOSIS — Z23 Encounter for immunization: Secondary | ICD-10-CM | POA: Insufficient documentation

## 2024-07-18 DIAGNOSIS — F17210 Nicotine dependence, cigarettes, uncomplicated: Secondary | ICD-10-CM

## 2024-07-18 DIAGNOSIS — Z7189 Other specified counseling: Secondary | ICD-10-CM | POA: Insufficient documentation

## 2024-07-18 DIAGNOSIS — Z0000 Encounter for general adult medical examination without abnormal findings: Secondary | ICD-10-CM | POA: Insufficient documentation

## 2024-07-18 DIAGNOSIS — R002 Palpitations: Secondary | ICD-10-CM

## 2024-07-18 DIAGNOSIS — G629 Polyneuropathy, unspecified: Secondary | ICD-10-CM
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
## 2024-07-18 NOTE — Nursing Note (Signed)
 07/18/24 1302   Comprehensive Health Assessment-Adult   Do you wish to complete this form? Yes   During the past 4 weeks, how would you rate your health in general? Good   During the past 4 weeks, how much difficulty have you had doing your usual activities inside and outside your home because of medical or emotional problems? No difficulty at all   During the past 4 weeks, was someone available to help you if you needed and wanted help? Yes, as much as I wanted   In the past year, how many times have you gone to the emergency department or been admitted to a hospital for a health problem? None   Are you generally satisfied with your sleep? Yes   Do you have enough money to buy things you need in everyday life, such as food, clothing, medicines, and housing? Yes, always   Can you get to places beyond walking distance without help?  (For example, can you drive your own car or travel alone on buses)? No   Do you fasten your seatbelt when you are in a car? Yes, usually   Do you exercise 20 minutes 3 or more days per week (such as walking, dancing, biking, mowing grass, swimming)? Yes, most of the time   How often do you eat food that is healthy (fruits, vegetables, lean meats) instead of unhealthy (sweets, fast food, junk food, fatty foods)? Some of the time   Have your parents, brothers or sisters had any of the following problems before the age of 46? (check all that apply) Cancer;Heart problems, or hardening of the arteries   How often do you have trouble taking medicines the eay you are told to take them? I always take them as prescribed   Do you need any help communicating with your doctors and nurses because of vision or hearing problems? No   During the past 12 months, have you experienced confusion or memory loss that is happening more often or is getting worse? No   Do you have one person you think of as your personal doctor (primary care provider or family doctor)? Yes   If you are seeing a Primary Care  Provider (PCP) or family doctor. please list their name [email or web address]   Are you now also seeing any specialist physician(s) (such as eye doctor, foot doctor, skin doctor)? No   How confident are you that you can control or manage most of your health problems? Very confident

## 2024-07-18 NOTE — Patient Instructions (Signed)
 Medicare Preventive Services  Medicare coverage information Recommendation for YOU   Heart Disease and Diabetes   Lipid profile Every 5 years or more often if at risk for cardiovascular disease     Lab Results   Component Value Date    CHOLESTEROL 213 (H) 07/15/2023    HDLCHOL 66 07/15/2023    LDLCHOL 124 (H) 07/15/2023    TRIG 116 07/15/2023         Diabetes Screening    Yearly for those at risk for diabetes, 2 tests per year for those with prediabetes Last Glucose: 104    Diabetes Self Management Training or Medical Nutrition Therapy  For those with diabetes, up to 10 hrs initial training within a year, subsequent years up to 2 hrs of follow up training Optional for those with diabetes     Medical Nutrition Therapy  Three hours of one-on-one counseling in first year, two hours in subsequent years Optional for those with diabetes, kidney disease   Intensive Behavioral Therapy for Obesity  Face-to-face counseling, first month every week, month 2-6 every other week, month 7-12 every month if continued progress is documented Optional for those with Body Mass Index 30 or higher  Your Body mass index is 21.97 kg/m.   Tobacco Cessation (Quitting) Counseling   Covers up to 8 smoking and tobacco-use cessation counseling sessions in a 51-month period.    Optional for those that use tobacco   Cancer Screening Last Completion Date   Colorectal screening   For anyone age 27 to 60 or any age if high risk:  Screening Colonoscopy every 10 yrs if low risk,  more frequent if higher risk  OR  Cologuard Stool DNA test once every 3 years OR  Fecal Occult Blood Testing yearly OR  Flexible  Sigmoidoscopy  every 5 yr OR  CT Colonography every 5 yrs      See below for due date if applicable.   Screening Pap Test   Recommended every 3 years for all women age 8 to 1, or every five years if combined with HPV test (routine screening not needed after total hysterectomy).  Medicare covers every 2 years or yearly if high risk.  Screening  Pelvic Exam   Medicare covers every 2 years, yearly if high risk or childbearing age with abnormal Pap in last 3 yrs.     See below for due date if applicable.   Screening Mammogram   Recommended every 2 years for women age 70 to 1, or more frequent if you have a higher risk. Selectively recommended for women between 40-49 based on shared decisions about risk. Covered by Medicare up to every year for women age 5 or older   See below for due date if applicable.         Lung Cancer Screening  Annual low dose computed tomography (LDCT scan) is recommended for those age 62-80 who smoked 20 pack-years and are current smokers or quit smoking within past 15 years, after counseling by your doctor or nurse clinician about the possible benefits or harms.   --05/14/2024  See below for due date if applicable.   Vaccinations   Respiratory syncytial virus (RSV)  Age 8 years or older: Based on shared clinical decision-making with your provider.  Pneumococcal Vaccine  Recommended routinely age 68+ with one or two separate vaccines based on your risk. Recommended before age 48 if medical conditions with increased risk  Seasonal Influenza Vaccine  Once every flu season   Hepatitis  B Vaccine  3 doses if risk (including anyone with diabetes or liver disease)  Shingles Vaccine  Two doses at age 53 or older  Diphtheria Tetanus Pertussis Vaccine  ONCE as adult, booster every 10 years     Immunization History   Administered Date(s) Administered    Covid-19 Vaccine,Moderna,12 Years+ 10/11/2019, 11/08/2019, 06/06/2020    FLUZONE HD VACCINE (ADMIN) 05/03/2017, 05/21/2018    Influenza Vaccine, 65+ (FLUAD) 04/14/2019, 05/25/2020, 06/01/2022, 05/08/2023    PREVNAR 13 07/12/2019     Shingles vaccine and Diphtheria Tetanus Pertussis vaccines are available at pharmacies or local health department without a prescription.   Other Preventative Screening  Last Completion Date   Bone Densitometry   Screening: All females ages 12 and older every  10 years if initial screening normal. Postmenopausal women ages 49-64 need screening with one or more risk factor: previous fracture, parental hip fracture, current smoker, low body weight, excessive alcohol use, Rheumatoid Arthritis   For women with diagnosed Osteoporosis, follow up is recommended every 2 years or a frequency recommended by your provider.     --11/06/2023  See below for due date if applicable.     Glaucoma Screening   Yearly if in high risk group such as diabetes, family history, African American age 41+ or Hispanic American age 37+   See your eye care provider for screening.   Hepatitis C Screening   Recommended  for those born between ages 18-79 years.   --07/15/2023  See below for due date if applicable.     HIV Testing  Recommended routinely at least ONCE, covered every year for age 73 to 42 regardless of risk, and every year for age over 33 who ask for the test or higher risk. Yearly or up to 3 times in pregnancy         See below for due date if applicable.   Abdominal Aortic Aneurysm Screening Ultrasound   Once with a family history of abdominal aortic aneurysms OR a female between65-75 and have smoked at least 100 cigarettes in your lifetime.         See below for due date if applicable.       Your Personalized Schedule for Preventive Tests   Health Maintenance: Pending and Last Completed       Date Due Completion Date    Pneumococcal Vaccination, Age 48+ (2 of 2 - PPSV23, PCV20, or PCV21) 09/06/2019 07/12/2019    Tetanus-Diptheria Vaccines (1 - Tdap) 07/18/2025 (Originally 09/07/1964) ---    Shingles Vaccine (1 of 2) 07/18/2025 (Originally 09/07/1996) ---    RSV Adult 60+ or Pregnancy (1 - 1-dose 75+ series) 07/18/2025 (Originally 09/07/2021) ---    CT Lung Cancer Screening 05/14/2025 05/14/2024    Osteoporosis screening 11/05/2025 11/06/2023                For Information on Advanced Directives for Health Care:  [place]:  [email or web address]  [place], [place], [place], [place] General Information:  [email or web address]

## 2024-07-18 NOTE — Nursing Note (Signed)
 07/18/24 1301   PHQ 9 (follow up)   Little interest or pleasure in doing things. 1   Feeling down, depressed, or hopeless 0   Trouble falling or staying asleep, or sleeping too much. 0   Feeling tired or having little energy 2   Poor appetite or overeating 2   Feeling bad about yourself/ that you are a failure in the past 2 weeks? 0   Trouble concentrating on things in the past 2 weeks? 0   Moving/Speaking slowly or being fidgety or restless  in the past 2 weeks? 0   Thoughts that you would be better off DEAD, or of hurting yourself in some way. 0   If you checked off any problems, how difficult have these problems made it for you to do your work, take care of things at home, or get along with other people? Not difficult at all   PHQ 9 Total 5   Interpretation of Total Score Mild depression

## 2024-07-18 NOTE — Nursing Note (Signed)
 07/18/24 1304   Advance Directives   Does patient have a living will or MPOA Yes   Activities of Daily Living   Do you need help with dressing, bathing, or walking? No   Do you need help with shopping, housekeeping, medications, or finances? No   Do you have rugs in hallways, broken steps, or poor lighting? No   Do you have grab bars in your bathroom, non-slip strips in your tub, and hand rails on your stairs? No   Cognitive Function Screen   What is you age? 1   What is the time to the nearest hour? 1   Remember this address: [address]   What is the year? 1   What is the name of this clinic? 1   Can the patient recognize two persons (the doctor, the nurse, home help, etc.)? 1   What is the date of your birth? (day and month sufficient)  1   In what year did World War II end? 0   Who is the current president of the [place] ? 1   Count from 20 down to 1? 1   What address did I give you earlier? 1   Total Score 9   Interpretation of Total Score Greater than 6 Normal   Depression Screen   Little interest or pleasure in doing things. 1   Feeling down, depressed, or hopeless 0   PHQ 2 Total 1   Trouble falling or staying asleep, or sleeping too much. 0   Feeling tired or having little energy 2   Poor appetite or overeating 2   Feeling bad about yourself/ that you are a failure in the past 2 weeks? 0   Trouble concentrating on things in the past 2 weeks? 0   Moving/Speaking slowly or being fidgety or restless  in the past 2 weeks? 0   Thoughts that you would be better off DEAD, or of hurting yourself in some way. 0   If you checked off any problems, how difficult have these problems made it for you to do your work, take care of things at home, or get along with other people? Not difficult at all   PHQ 9 Total 5   Interpretation of Total Score Mild depression   Pain Score   Pain Score Zero   Substance Use Screening   In Past 12 MONTHS, how often have you used any tobacco product (for example,  cigarettes, e-cigarettes, cigars, pipes, or smokeless tobacco)? Daily   In the PAST 3 MONTHS, did you smoke a cigarette containing tobacco or use any other nicotine delivery product (i.e., e-cigarette, vaping or chewing tobacco)? Yes   In the PAST 3 MONTHS, did you usually smoke more than 10 cigarettes, vape, use an e-cigarette or chew tobacco more than 10 times each day? No   In the PAST 3 MONTHS, did you usually smoke/use an e-cigarette, vape or chew tobacco within 30 minutes after waking? No   In the PAST 12 MONTHS, how often have you had 5 (men)/4 (women) or more drinks containing alcohol in one day? Never   In the PAST 12 months, how often have you used any prescription medications just for the feeling, more than prescribed, or that were not prescribed for you? Prescriptions may include: opioids, benzodiazepines, medications for ADHD Never   In the PAST 12 MONTHS, how often have you used any drugs, including marijuana, cocaine or crack, heroin, methamphetamine, hallucinogens, ecstasy/MDMA? Never  Fall Risk Assessment   Do you feel unsteady when standing or walking? No   Do you worry about falling? Yes   Have you fallen in the past year? Yes   How many times have you fallen? Once   Were you ever injured from falling? No   Timed up and go test (in seconds) 2   Urinary Incontinence Screen   Do you ever leak urine when you don't want to? No

## 2024-07-18 NOTE — Progress Notes (Signed)
 FAMILY MEDICINE, MEDICAL OFFICE BUILDING  [address]  [place] [address]  Operated by [facility]  Medicare Annual Wellness Visit    Name: Crystal Ballard MRN:  [id number]   Date: 07/18/2024 Age: 78 y.o.     History of Present Illness    Crystal Ballard is a 78 year old female who presents for her annual Medicare wellness visit.    She is up to date on her DEXA scan, completed in April 2025, and her annual lung CT, completed in October 2025. She has received the Prevnar 13 vaccine in 2020 but has not yet received the flu vaccine this year. She recalls receiving a shingles vaccine in [place] , which was a single shot, likely Zostavax. She is unsure about her last tetanus vaccine, possibly received three years ago in [place] .    She has a history of smoking, currently smoking half a pack per day with a 33.5 pack-year history.    She has no advanced care directives on file and has expressed a need to discuss this further.           07/18/2024     1:02 PM 07/15/2023     1:07 PM   Comprehensive Health Assessment-Adult   Do you wish to complete this form? Yes Yes   During the past 4 weeks, how would you rate your health in general? Good Fair   During the past 4 weeks, how much difficulty have you had doing your usual activities inside and outside your home because of medical or emotional problems? No difficulty at all Much difficulty   During the past 4 weeks, was someone available to help you if you needed and wanted help? Yes, as much as I wanted Yes, as much as I wanted   In the past year, how many times have you gone to the emergency department or been admitted to a hospital for a health problem? None None   Are you generally satisfied with your sleep? Yes Yes   Do you have enough money to buy things you need in everyday life, such as food, clothing, medicines, and housing? Yes, always Yes, always   Can you get to places beyond walking distance without help?  (For example,  can you drive your own car or travel alone on buses)? No Yes   Do you fasten your seatbelt when you are in a car? Yes, usually Yes, usually   Do you exercise 20 minutes 3 or more days per week (such as walking, dancing, biking, mowing grass, swimming)? Yes, most of the time Yes, most of the time   How often do you eat food that is healthy (fruits, vegetables, lean meats) instead of unhealthy (sweets, fast food, junk food, fatty foods)? Some of the time Most of the time   Have your parents, brothers or sisters had any of the following problems before the age of 58? (check all that apply) Cancer;Heart problems, or hardening of the arteries Mental health problems such as depression, bipolar, severe anxiety, postpartum depression;Cancer   How often do you have trouble taking medicines the eay you are told to take them? I always take them as prescribed I always take them as prescribed   Do you need any help communicating with your doctors and nurses because of vision or hearing problems? No No   During the past 12 months, have you experienced confusion or memory loss that is happening more often or is getting worse? No No  Do you have one person you think of as your personal doctor (primary care provider or family doctor)? Yes Yes   If you are seeing a Primary Care Provider (PCP) or family doctor. please list their name [facility]   Are you now also seeing any specialist physician(s) (such as eye doctor, foot doctor, skin doctor)? No No   How confident are you that you can control or manage most of your health problems? Very confident Very confident     I have reviewed and updated as appropriate the past medical, family and social history. 07/18/2024 as summarized below:  Past Medical History:   Diagnosis Date    Anxiety with depression     Chronic obstructive airway disease     Diverticulitis     Hiatal hernia     Hypothyroidism     Insomnia      Past Surgical History:   Procedure Laterality Date     Colonoscopy      Hx hysterectomy      Hx wisdom teeth extraction      Shoulder surgery      Spine surgery       Current Outpatient Medications   Medication Sig    albuterol  sulfate (PROVENTIL  OR VENTOLIN  OR PROAIR ) 90 mcg/actuation Inhalation oral inhaler Take 1-2 Puffs by inhalation Every 6 hours as needed    amoxicillin -pot clavulanate (AUGMENTIN ) 875-125 mg Oral Tablet Take 1 Tablet by mouth Twice daily    busPIRone  (BUSPAR ) 5 mg Oral Tablet Take 1 tablet by mouth three times daily as needed    calcium carbonate/vitamin D3 (VITAMIN D -3 ORAL) Take 500 mg by mouth Once a day    docusate sodium (COLACE) 100 mg Oral Capsule Take 1 Capsule (100 mg total) by mouth One time    gabapentin  (NEURONTIN ) 100 mg Oral Capsule Take 1 Capsule (100 mg total) by mouth Three times a day    magnesium oxide 200 Oral Tablet Take 1 Tablet (200 mg total) by mouth Twice daily (Patient not taking: Reported on 07/18/2024)    omeprazole  (PRILOSEC) 40 mg Oral Capsule, Delayed Release(E.C.) Take 1 Capsule (40 mg total) by mouth Daily    promethazine -dextromethorphan (PHENERGAN -DM) 6.25-15 mg/5 mL Oral Syrup Take 5 mL by mouth Four times a day as needed for Cough     Family Medical History:       Problem Relation (Age of Onset)    Cervical Cancer Mother    Heart Disease Daughter    Hypertension (High Blood Pressure) Son    No Known Problems Father          Social History     Socioeconomic History    Marital status: Divorced   Tobacco Use    Smoking status: Every Day     Current packs/day: 0.50     Average packs/day: 0.5 packs/day for 67.0 years (33.5 ttl pk-yrs)     Types: Cigarettes     Start date: 1959    Smokeless tobacco: Never   Vaping Use    Vaping status: Never Used   Substance and Sexual Activity    Alcohol use: Yes     Alcohol/week: 1.0 standard drink of alcohol     Types: 1 Cans of beer per week    Drug use: Never    Sexual activity: Yes     Social Determinants of Health     Financial Resource Strain: Low Risk (01/17/2020)    Received  from [facility]    Overall Financial  Resource Strain (CARDIA)     Difficulty of Paying Living Expenses: Not hard at all   Transportation Needs: No Transportation Needs (01/17/2020)    Received from [facility] - Transportation     Lack of Transportation (Medical): No     Lack of Transportation (Non-Medical): No   Social Connections: Moderately Isolated (01/17/2020)    Received from [facility]    Social Connection and Isolation Panel     In a typical week, how many times do you talk on the phone with family, friends, or neighbors?: More than three times a week     How often do you get together with friends or relatives?: More than three times a week     How often do you attend church or religious services?: Never     Do you belong to any clubs or organizations such as church groups, unions, fraternal or athletic groups, or school groups?: No     How often do you attend meetings of the clubs or organizations you belong to?: Never     Are you married, widowed, divorced, separated, never married, or living with a partner?: Living with partner   Intimate Partner Violence: Not At Risk (01/17/2020)    Received from [facility]    Humiliation, Afraid, Rape, and Kick questionnaire     Within the last year, have you been afraid of your partner or ex-partner?: No     Within the last year, have you been humiliated or emotionally abused in other ways by your partner or ex-partner?: No     Within the last year, have you been kicked, hit, slapped, or otherwise physically hurt by your partner or ex-partner?: No     Within the last year, have you been raped or forced to have any kind of sexual activity by your partner or ex-partner?: No   Health Literacy: Low Risk (01/21/2024)    Health Literacy     SDOH Health Literacy: Never       List of Current Health Care Providers   Care Team       PCP       Name Type Specialty Phone Number    Silva Bart, APRN, CNP Nurse Practitioner FAMILY NURSE PRACTITIONER [phone]               Care Team       No care team found                      Health Maintenance   Topic Date Due    Pneumococcal Vaccination, Age 32+ (2 of 2 - PPSV23, PCV20, or PCV21) 09/06/2019    Tetanus-Diptheria Vaccines (1 - Tdap) 07/18/2025 (Originally 09/07/1964)    Shingles Vaccine (1 of 2) 07/18/2025 (Originally 09/07/1996)    RSV Adult 60+ or Pregnancy (1 - 1-dose 75+ series) 07/18/2025 (Originally 09/07/2021)    CT Lung Cancer Screening  05/14/2025    Osteoporosis screening  11/05/2025    Hepatitis C screening  Completed    Medicare Annual Wellness Visit - Calendar Year Insurers  Completed    Covid-19 Vaccine (Shared decision making)  Discontinued     Medicare Wellness Assessment      Advance Directives   Does patient have a living will or MPOA: Yes                  Activities of Daily Living   Do you need help with dressing, bathing, or walking?:  No   Do you need help with shopping, housekeeping, medications, or finances?: No   Do you have rugs in hallways, broken steps, or poor lighting?: No   Do you have grab bars in your bathroom, non-slip strips in your tub, and hand rails on your stairs?: No   Cognitive Function Screen (1=Yes, 0=No)   What is you age?: Correct   What is the time to the nearest hour?: Correct   What is the year?: Correct   What is the name of this clinic?: Correct   Can the patient recognize two persons (the doctor, the nurse, home help, etc.)?: Correct   What is the date of your birth? (day and month sufficient) : Correct   In what year did World War II end?: Incorrect   Who is the current president of the [place] ?: Correct   Count from 20 down to 1?: Correct   What address did I give you earlier?: Correct   Total Score: 9   Interpretation of Total Score: Greater than 6 Normal   Fall Risk Screen   Do you feel unsteady when standing or walking?: No  Do you worry about falling?: Yes  Have you fallen in the past year?: Yes  How many times have you fallen?: Once  Were you ever injured from falling?:  No  Timed up and go test (in seconds): 2   Depression Screen     Little interest or pleasure in doing things.: Several Days  Feeling down, depressed, or hopeless: Not at all  PHQ 2 Total: 1  Trouble falling or staying asleep, or sleeping too much.: Not at all  Feeling tired or having little energy: More than half the days  Poor appetite or overeating: More than half the days  Feeling bad about yourself/ that you are a failure in the past 2 weeks?: Not at all  Trouble concentrating on things in the past 2 weeks?: Not at all  Moving/Speaking slowly or being fidgety or restless  in the past 2 weeks?: Not at all  Thoughts that you would be better off DEAD, or of hurting yourself in some way.: Not at all  PHQ 9 Total: 5  Interpretation of Total Score: 5-9 Mild depression     Pain Score   Pain Score:   0 - No pain    Substance Use-Abuse Screening     Tobacco Use     In Past 12 MONTHS, how often have you used any tobacco product (for example, cigarettes, e-cigarettes, cigars, pipes, or smokeless tobacco)?: Daily  In the PAST 3 MONTHS, did you smoke a cigarette containing tobacco or use any other nicotine delivery product (i.e., e-cigarette, vaping or chewing tobacco)?: Yes  In the PAST 3 MONTHS, did you usually smoke more than 10 cigarettes, vape, use an e-cigarette or chew tobacco more than 10 times each day?: No  In the PAST 3 MONTHS, did you usually smoke/use an e-cigarette, vape or chew tobacco within 30 minutes after waking?: No     Alcohol use     In the PAST 12 MONTHS, how often have you had 5 (men)/4 (women) or more drinks containing alcohol in one day?: Never     Prescription Drug Use     In the PAST 12 months, how often have you used any prescription medications just for the feeling, more than prescribed, or that were not prescribed for you? Prescriptions may include: opioids, benzodiazepines, medications for ADHD: Never  Illicit Drug Use   In the PAST 12 MONTHS, how often have you used any drugs,  including marijuana, cocaine or crack, heroin, methamphetamine, hallucinogens, ecstasy/MDMA?: Never         Urine Incontinence Screen   Urinary Incontinence Screen  Do you ever leak urine when you don't want to?: No     Allergies[1]    OBJECTIVE:   BP 137/80   Pulse 67   Temp 36.6 C (97.8 F) (Temporal)   Resp 18   Ht 1.6 m (5' 3)   Wt 56.2 kg (124 lb)   SpO2 98%   BMI 21.97 kg/m     Physical Exam    CONSTITUTIONAL: Patient is not in acute distress. Normal appearance.  HENT: Mucous membranes are moist. Pupils are equal, round, and reactive to light.  CARDIOVASCULAR: Normal rate and regular rhythm. Normal pulses. Occasional premature ventricular contraction, otherwise normal heart sounds. No murmur heard.  PULMONARY: Pulmonary effort is normal. Normal breath sounds.  MUSCULOSKELETAL: No swelling or deformity. No tenderness.  SKIN: Skin is warm and dry.  NEUROLOGICAL: No focal deficit present. Alert and oriented to person, place, and time.  PSYCHIATRIC: Mood and affect normal.       Health Maintenance Due   Topic Date Due    Pneumococcal Vaccination, Age 59+ (2 of 2 - PPSV23, PCV20, or PCV21) 09/06/2019      Assessment & Plan     Adult Wellness Visit  Annual wellness visit completed. Vaccinations discussed: RSV, shingles, tetanus, pneumococcal. Due for pneumococcal vaccine. RSV vaccine recommended due to age. Shingles vaccine history unclear. Tetanus likely within 10 years. Advanced care directives discussed.  - Administered (PCV21) vaccine today.  - Provided paperwork for medical power of attorney and living will.    Nicotine dependence, cigarettes  Smokes half a pack per day, 33.5 pack-year history.   Tobacco cessation counseling performed.     Assessment/Plan   1. Medicare annual wellness visit, subsequent    2. Advanced care planning/counseling discussion    3. Need for pneumococcal vaccination       Identified Risk Factors/ Recommended Actions   Fall Risk Follow up plan of care: Discussed optimizing  home safety    Advanced Directives: Patient agreeable to - Advanced Directives discussed and patient elected to take home to review.    Orders Placed This Encounter    Capvaxive (PCV 21) (Admin)      The patient has been educated about risk factors and recommended preventive care. Written Prevention Plan completed/ updated and given to patient (see After Visit Summary).  Return in 1 year (on 07/18/2025) for Medicare Well Visit.  [facility], APRN, CNP  This note was created with assistance from [employer] via capture of conversational audio. Consent was obtained from the patient and all parties present prior to recording.       [1]   Allergies  Allergen Reactions    Pravastatin Myalgia and  Other Adverse Reaction (Add comment)     Foot pain at 20mg     Zetia  [Ezetimibe ] Myalgia

## 2024-08-01 ENCOUNTER — Ambulatory Visit (HOSPITAL_COMMUNITY): Payer: Self-pay

## 2024-08-02 ENCOUNTER — Ambulatory Visit (INDEPENDENT_AMBULATORY_CARE_PROVIDER_SITE_OTHER): Payer: Self-pay

## 2024-08-12 ENCOUNTER — Ambulatory Visit

## 2024-08-12 ENCOUNTER — Other Ambulatory Visit: Payer: Self-pay

## 2024-08-12 ENCOUNTER — Encounter (INDEPENDENT_AMBULATORY_CARE_PROVIDER_SITE_OTHER): Payer: Self-pay

## 2024-08-12 VITALS — BP 156/69 | HR 92 | Temp 98.6°F | Resp 18 | Wt 124.8 lb

## 2024-08-12 DIAGNOSIS — G629 Polyneuropathy, unspecified: Secondary | ICD-10-CM | POA: Insufficient documentation

## 2024-08-12 DIAGNOSIS — G4700 Insomnia, unspecified: Secondary | ICD-10-CM | POA: Insufficient documentation

## 2024-08-12 DIAGNOSIS — R030 Elevated blood-pressure reading, without diagnosis of hypertension: Secondary | ICD-10-CM | POA: Insufficient documentation

## 2024-08-12 DIAGNOSIS — R002 Palpitations: Secondary | ICD-10-CM | POA: Insufficient documentation

## 2024-08-12 DIAGNOSIS — F325 Major depressive disorder, single episode, in full remission: Secondary | ICD-10-CM | POA: Insufficient documentation

## 2024-08-12 DIAGNOSIS — G2581 Restless legs syndrome: Secondary | ICD-10-CM | POA: Insufficient documentation

## 2024-08-12 DIAGNOSIS — E039 Hypothyroidism, unspecified: Secondary | ICD-10-CM | POA: Insufficient documentation

## 2024-08-12 DIAGNOSIS — E785 Hyperlipidemia, unspecified: Secondary | ICD-10-CM | POA: Insufficient documentation

## 2024-08-12 LAB — CBC WITH DIFF
BASOPHIL #: 0 x10ˆ3/uL (ref 0.00–0.10)
BASOPHIL %: 0 % (ref 0–1)
EOSINOPHIL #: 0 x10ˆ3/uL (ref 0.00–0.50)
EOSINOPHIL %: 1 % (ref 1–7)
LYMPHOCYTE #: 2 x10ˆ3/uL (ref 1.10–3.10)
LYMPHOCYTE %: 28 % (ref 16–46)
MCH: 31.3 pg (ref 24.7–32.8)
MCHC: 34.4 g/dL (ref 32.3–35.6)
MONOCYTE #: 0.6 x10ˆ3/uL (ref 0.20–0.90)
MONOCYTE %: 8 % (ref 4–11)
NEUTROPHIL %: 63 % (ref 43–77)
PLATELETS: 100 x10ˆ3/uL — ABNORMAL LOW (ref 140–440)
RDW: 13.9 % (ref 12.3–17.7)
WBC: 7 x10ˆ3/uL (ref 3.8–11.8)

## 2024-08-12 LAB — SCAN DIFFERENTIAL
PLATELET MORPHOLOGY COMMENT: DECREASED
RBC MORPHOLOGY COMMENT: NORMAL
SCHISTOCYTES: ABSENT

## 2024-08-12 LAB — HEPATIC FUNCTION PANEL
ALBUMIN/GLOBULIN RATIO: 1.9 — ABNORMAL HIGH (ref 0.8–1.4)
ALKALINE PHOSPHATASE: 54 U/L (ref 34–104)
ALT (SGPT): 12 U/L (ref 7–52)
AST (SGOT): 17 U/L (ref 13–39)
BILIRUBIN DIRECT: 0.03 md/dL (ref 0.03–0.18)
BILIRUBIN TOTAL: 0.5 mg/dL (ref 0.3–1.0)
BILIRUBIN, INDIRECT: 0.47 mg/dL (ref <=1)
GLOBULIN: 2.3 (ref 2.0–3.5)
PROTEIN TOTAL: 6.7 g/dL (ref 6.4–8.9)

## 2024-08-12 LAB — BASIC METABOLIC PANEL
ANION GAP: 6 mmol/L (ref 4–13)
BUN/CREA RATIO: 15 (ref 6–22)
BUN: 9 mg/dL (ref 7–25)
CALCIUM: 9.4 mg/dL (ref 8.6–10.3)
CHLORIDE: 106 mmol/L (ref 98–107)
CO2 TOTAL: 30 mmol/L (ref 21–31)
CREATININE: 0.6 mg/dL (ref 0.60–1.30)
ESTIMATED GFR: 92 mL/min/1.73mˆ2 (ref >59)
GLUCOSE: 104 mg/dL (ref 74–109)
OSMOLALITY, CALCULATED: 282 mosm/kg (ref 270–290)
POTASSIUM: 3.8 mmol/L (ref 3.5–5.1)
SODIUM: 142 mmol/L (ref 136–145)

## 2024-08-12 LAB — LIPID PANEL
CHOL/HDL RATIO: 3.2
HDL CHOL: 68 mg/dL (ref >=40)
LDL CALC: 131 mg/dL — ABNORMAL HIGH (ref 0–100)
TRIGLYCERIDES: 91 mg/dL (ref <=150)
VLDL CALC: 18 mg/dL (ref 0–50)

## 2024-08-12 LAB — MAGNESIUM: MAGNESIUM: 2.1 mg/dL (ref 1.9–2.7)

## 2024-08-12 LAB — THYROID STIMULATING HORMONE WITH FREE T4 REFLEX: TSH: 1.361 u[IU]/mL (ref 0.450–5.330)

## 2024-08-12 MED ORDER — OMEPRAZOLE 40 MG CAPSULE,DELAYED RELEASE: 40.0000 mg | DELAYED_RELEASE_CAPSULE | Freq: Every day | ORAL | 1 refills | Status: AC

## 2024-08-12 MED ORDER — GABAPENTIN 100 MG CAPSULE: 100.0000 mg | ORAL_CAPSULE | Freq: Three times a day (TID) | ORAL | 1 refills | Status: AC

## 2024-08-12 MED ORDER — BUSPIRONE 5 MG TABLET: 5.0000 mg | ORAL_TABLET | Freq: Three times a day (TID) | ORAL | 0 refills | Status: AC | PRN

## 2024-08-12 NOTE — Nursing Note (Signed)
 08/12/24 1019   Domestic Violence   Because we are aware of abuse and domestic violence today, we ask all patients: Are you being hurt, hit, or frightened by anyone at your home or in your life?  N   Basic Needs   Do you have any basic needs within your home that are not being met? (such as Food, Shelter, Museum/gallery Curator, paying for bills and/or medications) N   Advanced Directives   Do you have any advanced directives? Living Will & MPOA

## 2024-08-12 NOTE — Progress Notes (Signed)
 [facility] [place]  FAMILY MEDICINE, MEDICAL OFFICE BUILDING  [address]  [place] [address]  [phone]   Crystal Ballard  10-Oct-1945  [id number]    Date of Service: 08/12/2024  Chief complaint:   Chief Complaint   Patient presents with    Follow Up     Pt states she wants a different cardiologist      Labs Only     Subjective:   History of Present Illness    Crystal Ballard is a 79 year old female who presents for a three-month follow-up on disease management.    She was last seen on December 15th for her annual wellness visit. Her most recent blood work in May 2025 showed stable results, but she has suboptimal B12 levels. Lab orders for a BMP, CBC, hepatic panel, lipid panel, TSH, and magnesium were placed months ago but have not been completed.    She experiences peripheral neuropathy symptoms, describing tingling and a sensation of wearing socks even when she is not. She is prescribed Neurontin  100 mg three times a day but takes it only once daily, stating it does not help with her symptoms. The medication does not make her sleepy, and she often forgets to take it during the day. She takes a capsule at night, which she believes helps with leg cramps, along with calcium.    Regarding her insomnia, she goes to bed early, around 8 PM, and wakes up at 3 or 4 AM. She often feels tired and takes naps during the day, which she worries might affect her nighttime sleep. She attributes her early waking habits to her previous work schedule, where she had to wake up early for 19 and a half years.    She has a history of hyperlipidemia and was advised to complete lab work to monitor her condition. She is a half-pack per day smoker with a 33.5 pack-year history.    She mentions a recent fall from a ladder two weeks ago while putting up Christmas decorations, resulting in a skinned area and a large bruise, but she did not sustain any serious injuries.       Past Medical History:   Diagnosis  Date    Anxiety with depression     Chronic obstructive airway disease     Diverticulitis     Hiatal hernia     Hypothyroidism     Insomnia      Past Surgical History:   Procedure Laterality Date    COLONOSCOPY      HX HYSTERECTOMY      HX WISDOM TEETH EXTRACTION      SHOULDER SURGERY      SPINE SURGERY       Current Outpatient Medications   Medication Sig Dispense Refill    albuterol  sulfate (PROVENTIL  OR VENTOLIN  OR PROAIR ) 90 mcg/actuation Inhalation oral inhaler Take 1-2 Puffs by inhalation Every 6 hours as needed 1 Each 2    busPIRone  (BUSPAR ) 5 mg Oral Tablet Take 1 Tablet (5 mg total) by mouth Three times a day as needed 90 Tablet 0    calcium carbonate/vitamin D3 (VITAMIN D -3 ORAL) Take 500 mg by mouth Once a day      docusate sodium (COLACE) 100 mg Oral Capsule Take 1 Capsule (100 mg total) by mouth One time      gabapentin  (NEURONTIN ) 100 mg Oral Capsule Take 1 Capsule (100 mg total) by mouth Three times a day 90 Capsule 1  omeprazole  (PRILOSEC) 40 mg Oral Capsule, Delayed Release(E.C.) Take 1 Capsule (40 mg total) by mouth Daily 90 Capsule 1     No current facility-administered medications for this visit.     Allergies[1]    Review of Systems:  Any pertinent Review of Systems as addressed in the HPI above.    Objective:   BP (!) 156/69   Pulse 92   Temp 37 C (98.6 F) (Temporal)   Resp 18   Wt 56.6 kg (124 lb 12.8 oz)   SpO2 97%   BMI 22.11 kg/m      Physical Exam    CONSTITUTIONAL: Patient is not in acute distress. Normal appearance.  CARDIOVASCULAR: Normal rate and regular rhythm. No murmur.  PULMONARY: Pulmonary effort is normal. Normal breath sounds.  SKIN: Skin is warm and dry.  NEUROLOGICAL: No focal deficit present.  PSYCHIATRIC: Mood and affect normal.        Laboratory:  COMPLETE BLOOD COUNT   Lab Results   Component Value Date    WBC 6.4 12/28/2023    HGB 14.3 12/28/2023    HCT 42.9 (H) 12/28/2023    PLTCNT 125 (L) 12/28/2023     DIFFERENTIAL  Lab Results   Component Value Date    PMNS  65 12/28/2023    LYMPHOCYTES 25 12/28/2023    MONOCYTES 9 12/28/2023    EOSINOPHIL 1 12/28/2023    BASOPHILS 0 12/28/2023    BASOPHILS 0.02 12/28/2023    PMNABS 4.18 12/28/2023    LYMPHSABS 1.59 12/28/2023    EOSABS 0.05 12/28/2023    MONOSABS 0.61 12/28/2023     BASIC METABOLIC PANEL  Lab Results   Component Value Date    SODIUM 142 12/28/2023    POTASSIUM 4.4 12/28/2023    CHLORIDE 106 12/28/2023    CO2 30 12/28/2023    ANIONGAP 6 12/28/2023    BUN 11 12/28/2023    CREATININE 0.65 12/28/2023    BUNCRRATIO 17 12/28/2023    GFR 91 12/28/2023    CALCIUM 9.5 12/28/2023    GLUCOSENF 104 12/28/2023      LIPID PROFILE  Lab Results   Component Value Date    CHOLESTEROL 213 (H) 07/15/2023    HDLCHOL 66 07/15/2023    LDLCHOL 124 (H) 07/15/2023    TRIG 116 07/15/2023     LIVER TESTS  Lab Results   Component Value Date    ALBUMIN 3.9 12/28/2023    AST 19 12/28/2023    ALT 20 12/28/2023    ALKPHOS 72 12/28/2023    TOTBILIRUBIN 0.5 12/28/2023    BILIRUBINCON <0.10 07/15/2023     THYROID  STIMULATING HORMONE  Lab Results   Component Value Date    TSH 1.009 07/15/2023     VITAMIN B12  Lab Results   Component Value Date    VITB12 314 12/30/2023     Most recent results reviewed with patient.      ICD-10-CM    1. 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 Hyperlipidemia, unspecified hyperlipidemia type  E78.5       4. Peripheral neuropathy  G62.9       5. Insomnia, unspecified type  G47.00       6. Elevated BP without diagnosis of hypertension  R03.0       7. Hypothyroidism, unspecified type  E03.9       8. Restless leg  syndrome  G25.81          Assessment & Plan     Disease management follow-up  Previous blood work stable. Suboptimal B12 levels noted.  - Complete pending lab orders: BMP, CBC, hepatic function panel, lipid panel, TSH, and magnesium.    Peripheral neuropathy  Symptoms of tingling and sensation of wearing socks on legs. Neurontin  not taken as  directed. No improvement with current regimen.  - Adjust Neurontin  dosing to 100 mg in the morning and 200 mg at bedtime.    Insomnia  Reports waking up at 3-4 AM and feeling tired during the day. Discussed Neurontin  benefits for sleep and nerve pain.  - Adjust Neurontin  dosing to 100 mg in the morning and 200 mg at bedtime.    Vitamin B12 deficiency  Suboptimal B12 levels noted. Previously advised to take a B12 supplement.  - Ensure B12 supplementation is continued.    Tobacco use  Continues to smoke half a pack per day with a 33.5 pack-year history.        Counseling with regards to preventative care given. Medication summary was discussed with the patient to verify compliance and understanding. Medication safety was discussed. A good faith effort was made to reconcile the patient's medications. The patient was given the opportunity to ask questions and those questions were answered to the patient's satisfaction. The patient was encouraged to call with any additional questions or concerns. More than 50% of the visit was spent counseling and coordinating care.    Orders Placed This Encounter    CBC WITH DIFF    Referral to External Provider    omeprazole  (PRILOSEC) 40 mg Oral Capsule, Delayed Release(E.C.)    gabapentin  (NEURONTIN ) 100 mg Oral Capsule    busPIRone  (BUSPAR ) 5 mg Oral Tablet      Return in about 3 months (around 11/10/2024).  [facility], APRN, CNP 08/12/2024, 10:59  This note was created with assistance from [employer] via capture of conversational audio. Consent was obtained from the patient and all parties present prior to recording.       [1]   Allergies  Allergen Reactions    Pravastatin Myalgia and  Other Adverse Reaction (Add comment)     Foot pain at 20mg     Zetia  [Ezetimibe ] Myalgia

## 2024-08-15 ENCOUNTER — Ambulatory Visit (INDEPENDENT_AMBULATORY_CARE_PROVIDER_SITE_OTHER): Payer: Self-pay

## 2024-08-15 ENCOUNTER — Encounter (INDEPENDENT_AMBULATORY_CARE_PROVIDER_SITE_OTHER): Payer: Self-pay

## 2024-08-15 DIAGNOSIS — D696 Thrombocytopenia, unspecified: Secondary | ICD-10-CM

## 2024-08-18 ENCOUNTER — Ambulatory Visit

## 2024-08-18 ENCOUNTER — Other Ambulatory Visit: Payer: Self-pay

## 2024-08-18 DIAGNOSIS — D696 Thrombocytopenia, unspecified: Secondary | ICD-10-CM | POA: Insufficient documentation

## 2024-08-18 LAB — GIEMSA STAIN (CBC W/DIFF), WITH PATHOLOGIST REVIEW
BASOPHIL #: 0 x10ˆ3/uL (ref 0.00–0.10)
BASOPHIL %: 0 % (ref 0–1)
EOSINOPHIL #: 0 x10ˆ3/uL (ref 0.00–0.50)
EOSINOPHIL %: 1 % (ref 1–7)
HCT: 39.8 % (ref 31.2–41.9)
HGB: 13.7 g/dL (ref 10.9–14.3)
LYMPHOCYTE #: 1.6 x10ˆ3/uL (ref 1.10–3.10)
LYMPHOCYTE %: 23 % (ref 16–46)
MCH: 31.4 pg (ref 24.7–32.8)
MCHC: 34.5 g/dL (ref 32.3–35.6)
MCV: 91.1 fL (ref 75.5–95.3)
MONOCYTE #: 0.7 x10ˆ3/uL (ref 0.20–0.90)
MONOCYTE %: 9 % (ref 4–11)
MPV: 13 fL — ABNORMAL HIGH (ref 7.9–10.8)
NEUTROPHIL #: 4.8 x10ˆ3/uL (ref 1.90–8.20)
NEUTROPHIL %: 67 % (ref 43–77)
PLATELETS: 93 x10ˆ3/uL — ABNORMAL LOW (ref 140–440)
RBC: 4.36 x10ˆ6/uL (ref 3.63–4.92)
RDW: 14.2 % (ref 12.3–17.7)
WBC: 7.2 x10ˆ3/uL (ref 3.8–11.8)
WBCS UNCORRECTED: 7.2 x10^3/uL

## 2024-08-18 LAB — SCAN DIFFERENTIAL
PLATELET MORPHOLOGY COMMENT: DECREASED
SCHISTOCYTES: ABSENT

## 2024-08-18 LAB — PATH COMMENT (GIEMSA STAIN): PATHOLOGIST INTERPRETATION: ABNORMAL — AB

## 2024-08-19 ENCOUNTER — Other Ambulatory Visit (INDEPENDENT_AMBULATORY_CARE_PROVIDER_SITE_OTHER): Payer: Self-pay

## 2024-08-19 ENCOUNTER — Ambulatory Visit: Admission: RE | Admit: 2024-08-19 | Discharge: 2024-08-19 | Disposition: A | Payer: Self-pay | Source: Ambulatory Visit

## 2024-08-19 ENCOUNTER — Ambulatory Visit (INDEPENDENT_AMBULATORY_CARE_PROVIDER_SITE_OTHER): Payer: Self-pay

## 2024-08-19 DIAGNOSIS — D696 Thrombocytopenia, unspecified: Secondary | ICD-10-CM

## 2024-08-19 DIAGNOSIS — R002 Palpitations: Secondary | ICD-10-CM | POA: Insufficient documentation

## 2024-09-02 LAB — 3 DAY EXTENDED HOLTER MONITOR
Enrollment Period End: 20260119125801
Enrollment Period Start: 20260116100318
Heart rate (average): 74 {beats}/min
Isolated SVE count: 6677 episodes
Isolated VE Counts: 525 episodes
Longest supraventricular tachycardia episode - duration: 7.5 s
Longest supraventricular tachycardia episode - heart rate (: 99 {beats}/min
Longest supraventricular tachycardia episode - number of be: 12 beats
SVE Couplets Counts: 271 episodes
SVE Triplets Counts: 143 episodes
Supraventricular tachycardia - heart rate (average): 115 {beats}/min
Supraventricular tachycardia - number of episodes: 94
Supraventricular tachycardia with fastest heart rate - dura: 7.3 s
Supraventricular tachycardia with fastest heart rate - hear: 163 {beats}/min
Supraventricular tachycardia with fastest heart rate - numb: 19 beats
Ventricular tachycardia - heart rate (average): -1 {beats}/min

## 2024-09-05 ENCOUNTER — Ambulatory Visit (INDEPENDENT_AMBULATORY_CARE_PROVIDER_SITE_OTHER): Payer: Self-pay

## 2024-09-12 ENCOUNTER — Encounter (INDEPENDENT_AMBULATORY_CARE_PROVIDER_SITE_OTHER): Payer: Self-pay | Admitting: Surgery

## 2024-09-26 ENCOUNTER — Ambulatory Visit (INDEPENDENT_AMBULATORY_CARE_PROVIDER_SITE_OTHER): Payer: Self-pay

## 2024-09-26 ENCOUNTER — Ambulatory Visit (INDEPENDENT_AMBULATORY_CARE_PROVIDER_SITE_OTHER): Payer: Self-pay | Admitting: NURSE PRACTITIONER

## 2024-11-14 ENCOUNTER — Ambulatory Visit (INDEPENDENT_AMBULATORY_CARE_PROVIDER_SITE_OTHER): Payer: Self-pay

## 2024-11-15 ENCOUNTER — Ambulatory Visit (INDEPENDENT_AMBULATORY_CARE_PROVIDER_SITE_OTHER): Payer: Self-pay

## 2025-07-19 ENCOUNTER — Ambulatory Visit (INDEPENDENT_AMBULATORY_CARE_PROVIDER_SITE_OTHER): Payer: Self-pay
